# Patient Record
Sex: Female | Born: 1950 | Race: White | Hispanic: No | Smoking: Never smoker
Health system: Southern US, Community
[De-identification: ages and names within clinical notes are randomized; demographics above are authoritative.]

## PROBLEM LIST (undated history)

## (undated) DIAGNOSIS — J45909 Unspecified asthma, uncomplicated: Secondary | ICD-10-CM

## (undated) DIAGNOSIS — J302 Other seasonal allergic rhinitis: Secondary | ICD-10-CM

---

## 2000-03-11 ENCOUNTER — Encounter: Payer: Self-pay | Admitting: Obstetrics and Gynecology

## 2000-03-11 ENCOUNTER — Encounter: Admission: RE | Admit: 2000-03-11 | Discharge: 2000-03-11 | Payer: Self-pay | Admitting: Obstetrics and Gynecology

## 2000-03-22 ENCOUNTER — Encounter (INDEPENDENT_AMBULATORY_CARE_PROVIDER_SITE_OTHER): Payer: Self-pay

## 2000-03-22 ENCOUNTER — Other Ambulatory Visit: Admission: RE | Admit: 2000-03-22 | Discharge: 2000-03-22 | Payer: Self-pay | Admitting: *Deleted

## 2000-08-23 ENCOUNTER — Encounter: Payer: Self-pay | Admitting: Allergy and Immunology

## 2000-08-23 ENCOUNTER — Encounter: Admission: RE | Admit: 2000-08-23 | Discharge: 2000-08-23 | Payer: Self-pay | Admitting: *Deleted

## 2000-09-26 ENCOUNTER — Other Ambulatory Visit: Admission: RE | Admit: 2000-09-26 | Discharge: 2000-09-26 | Payer: Self-pay | Admitting: Obstetrics and Gynecology

## 2000-12-02 ENCOUNTER — Ambulatory Visit (HOSPITAL_BASED_OUTPATIENT_CLINIC_OR_DEPARTMENT_OTHER): Admission: RE | Admit: 2000-12-02 | Discharge: 2000-12-02 | Payer: Self-pay | Admitting: Family Medicine

## 2001-03-12 ENCOUNTER — Encounter: Payer: Self-pay | Admitting: Obstetrics and Gynecology

## 2001-03-12 ENCOUNTER — Encounter: Admission: RE | Admit: 2001-03-12 | Discharge: 2001-03-12 | Payer: Self-pay | Admitting: Obstetrics and Gynecology

## 2002-04-14 ENCOUNTER — Other Ambulatory Visit: Admission: RE | Admit: 2002-04-14 | Discharge: 2002-04-14 | Payer: Self-pay | Admitting: Obstetrics and Gynecology

## 2002-05-01 ENCOUNTER — Encounter: Payer: Self-pay | Admitting: Obstetrics and Gynecology

## 2002-05-01 ENCOUNTER — Encounter: Admission: RE | Admit: 2002-05-01 | Discharge: 2002-05-01 | Payer: Self-pay | Admitting: Obstetrics and Gynecology

## 2003-09-07 ENCOUNTER — Ambulatory Visit (HOSPITAL_COMMUNITY): Admission: RE | Admit: 2003-09-07 | Discharge: 2003-09-07 | Payer: Self-pay | Admitting: Gastroenterology

## 2003-09-27 ENCOUNTER — Ambulatory Visit (HOSPITAL_COMMUNITY): Admission: RE | Admit: 2003-09-27 | Discharge: 2003-09-27 | Payer: Self-pay | Admitting: Endocrinology

## 2003-10-21 ENCOUNTER — Other Ambulatory Visit: Admission: RE | Admit: 2003-10-21 | Discharge: 2003-10-21 | Payer: Self-pay | Admitting: Gynecology

## 2004-10-06 ENCOUNTER — Encounter: Admission: RE | Admit: 2004-10-06 | Discharge: 2004-10-06 | Payer: Self-pay | Admitting: Gynecology

## 2004-11-28 ENCOUNTER — Other Ambulatory Visit: Admission: RE | Admit: 2004-11-28 | Discharge: 2004-11-28 | Payer: Self-pay | Admitting: Gynecology

## 2006-03-07 ENCOUNTER — Other Ambulatory Visit: Admission: RE | Admit: 2006-03-07 | Discharge: 2006-03-07 | Payer: Self-pay | Admitting: Gynecology

## 2007-07-03 ENCOUNTER — Encounter: Admission: RE | Admit: 2007-07-03 | Discharge: 2007-07-03 | Payer: Self-pay | Admitting: Family Medicine

## 2008-06-01 ENCOUNTER — Ambulatory Visit (HOSPITAL_BASED_OUTPATIENT_CLINIC_OR_DEPARTMENT_OTHER): Admission: RE | Admit: 2008-06-01 | Discharge: 2008-06-01 | Payer: Self-pay | Admitting: Orthopedic Surgery

## 2008-07-15 ENCOUNTER — Encounter: Admission: RE | Admit: 2008-07-15 | Discharge: 2008-07-15 | Payer: Self-pay | Admitting: Gynecology

## 2010-05-14 ENCOUNTER — Encounter: Payer: Self-pay | Admitting: Family Medicine

## 2010-05-24 ENCOUNTER — Encounter: Payer: Self-pay | Admitting: Family Medicine

## 2010-08-08 LAB — POCT HEMOGLOBIN-HEMACUE: Hemoglobin: 12.6 g/dL (ref 12.0–15.0)

## 2010-09-05 NOTE — Op Note (Signed)
NAMEMERRANDA, BOLLS              ACCOUNT NO.:  [id number]   MEDICAL RECORD NO.:  [id number]          PATIENT TYPE:  AMB   LOCATION:  DSC                          FACILITY:  [facility]   PHYSICIAN:  Cindee Salt, M.D.       DATE OF BIRTH:  09-May-1950   DATE OF PROCEDURE:  06/01/2008  DATE OF DISCHARGE:                               OPERATIVE REPORT   PREOPERATIVE DIAGNOSIS:  Stenosing tenosynovitis, right ring finger.   POSTOPERATIVE DIAGNOSIS:  Stenosing tenosynovitis, right ring finger.   OPERATION:  Release of A1 pulley, right ring finger.   SURGEON:  Cindee Salt, MD   ASSISTANT:  Carolyne Fiscal, RN   ANESTHESIA:  Forearm-based IV regional.   ANESTHESIOLOGIST:  Zenon Mayo, MD   HISTORY:  The patient is a 60 year old female with a history of  triggering of right ring finger.  This has not responded to conservative  treatment.  She has elected to undergo surgical release.  Pre, peri, and  postoperative course have been described along with risks and  complications.  She is aware that there is no guarantee with surgery,  possibility of infection, recurrence injury to arteries, nerves, and  tendons, incomplete relief of symptoms, and dystrophy.  In the  preoperative area, the patient is seen.  The extremity is marked by both  the patient and surgeon.  Antibiotic is given.   PROCEDURE:  The patient was brought to the operating room where a  forearm-based IV regional anesthetic was carried out without difficulty.  She was prepped using DuraPrep, supine position with the right arm free.  A time-out was taken confirming the patient and procedure.  A test of  the extremity was performed.  She had some feeling.  This was locally  infiltrated with 0.25% Marcaine without epinephrine.  After adequate  anesthesia was afforded to the patient, an oblique incision was made  over the A1 pulley of the right ring finger and carried down through the  subcutaneous tissue.  Bleeders were  electrocauterized with bipolar.  The  neurovascular structures were identified and protected.  Retractors were  placed.  The A1 pulley was then identified on the radial aspect of the  A1 pulley.  An incision was made.  A small incision was made centrally  in the A2 pulley.  Finger was placed through a full range motion.  No  further triggering was noted.  Partial tenosynovectomy was performed.  The wound was copiously irrigated with saline.  The skin was then closed  with interrupted 5-0 Vicryl Rapide sutures.  A sterile compressive  dressing was applied.  The patient tolerated the procedure well.  On  deflation of the tourniquet, all fingers immediately pinked.  She was  taken to the recovery room for observation in satisfactory condition.  She will be discharged home to return to the [facility] of [place]  in 1 week on Vicodin.           ______________________________  Cindee Salt, M.D.     GK/MEDQ  D:  06/01/2008  T:  06/02/2008  Job:  [id number]   cc:  Selinda Flavin

## 2010-09-08 NOTE — Op Note (Signed)
NAME:  Diana Murray, Diana Murray                        ACCOUNT NO.:  [id number]   MEDICAL RECORD NO.:  [id number]                   PATIENT TYPE:  AMB   LOCATION:  ENDO                                 FACILITY:  [facility]   PHYSICIAN:  Graylin Shiver, M.D.                DATE OF BIRTH:  Sep 13, 1950   DATE OF PROCEDURE:  09/07/2003  DATE OF DISCHARGE:                                 OPERATIVE REPORT   PROCEDURE:  Colonoscopy with biopsy.   ENDOSCOPIST:  Graylin Shiver, M.D.   INDICATIONS FOR PROCEDURE:  Family history of colon polyps.   Informed consent was obtained after explanation of the risks of bleeding  infection and perforation.   PREOPERATIVE MEDICATIONS:  Fentanyl 50 mcg IV, Versed 7 mg IV.   DESCRIPTION OF PROCEDURE:  With the patient in the left lateral decubitus  position a rectal examination was performed; no masses were felt.  The  Olympus colonoscope was inserted into the rectum and advanced around the  colon to the cecum.  Cecal landmarks were identified.  The ileocecal valve  was intubated and the first few centimeters of terminal ileum were inspected  and looked normal.  The cecum and ascending colon were normal.  The  transverse colon normal.  The descending colon, sigmoid and rectum were  normal.   She tolerated the procedure well without complications.   IMPRESSION:  Normal colonoscopy to the cecum.   RECOMMENDATIONS:  I would recommend a follow up colonoscopy in 5 years in  view of the family history of colon polyps in her mother.                                               Graylin Shiver, M.D.    SFG/MEDQ  D:  09/07/2003  T:  09/07/2003  Job:  [id number]   cc:   Clovis Riley, NP  [facility] at [facility]

## 2010-12-20 ENCOUNTER — Other Ambulatory Visit: Payer: Self-pay | Admitting: Gynecology

## 2010-12-20 DIAGNOSIS — Z1231 Encounter for screening mammogram for malignant neoplasm of breast: Secondary | ICD-10-CM

## 2011-01-02 ENCOUNTER — Ambulatory Visit
Admission: RE | Admit: 2011-01-02 | Discharge: 2011-01-02 | Disposition: A | Discharge status: Home / Self Care | Payer: BC Managed Care – PPO | Source: Ambulatory Visit | Attending: Gynecology | Admitting: Gynecology

## 2011-01-02 DIAGNOSIS — Z1231 Encounter for screening mammogram for malignant neoplasm of breast: Secondary | ICD-10-CM

## 2015-05-17 ENCOUNTER — Other Ambulatory Visit: Payer: Self-pay

## 2015-05-17 DIAGNOSIS — Z1231 Encounter for screening mammogram for malignant neoplasm of breast: Secondary | ICD-10-CM

## 2015-05-18 ENCOUNTER — Other Ambulatory Visit: Payer: Self-pay | Admitting: Family Medicine

## 2015-05-18 DIAGNOSIS — N644 Mastodynia: Secondary | ICD-10-CM

## 2015-05-19 ENCOUNTER — Ambulatory Visit
Admission: RE | Admit: 2015-05-19 | Discharge: 2015-05-19 | Disposition: A | Discharge status: Home / Self Care | Payer: BLUE CROSS/BLUE SHIELD | Source: Ambulatory Visit | Attending: Family Medicine | Admitting: Family Medicine

## 2015-05-19 DIAGNOSIS — N644 Mastodynia: Secondary | ICD-10-CM

## 2015-12-13 ENCOUNTER — Inpatient Hospital Stay (HOSPITAL_COMMUNITY)
Admission: EM | Admit: 2015-12-13 | Discharge: 2015-12-22 | DRG: 163 | Disposition: A | Discharge status: Home Health | Payer: BLUE CROSS/BLUE SHIELD | Attending: Internal Medicine | Admitting: Internal Medicine

## 2015-12-13 ENCOUNTER — Encounter (HOSPITAL_COMMUNITY): Payer: Self-pay | Admitting: *Deleted

## 2015-12-13 ENCOUNTER — Emergency Department (HOSPITAL_COMMUNITY): Payer: BLUE CROSS/BLUE SHIELD

## 2015-12-13 DIAGNOSIS — D72829 Elevated white blood cell count, unspecified: Secondary | ICD-10-CM | POA: Diagnosis present

## 2015-12-13 DIAGNOSIS — J90 Pleural effusion, not elsewhere classified: Secondary | ICD-10-CM

## 2015-12-13 DIAGNOSIS — E872 Acidosis: Secondary | ICD-10-CM | POA: Diagnosis present

## 2015-12-13 DIAGNOSIS — R51 Headache: Secondary | ICD-10-CM | POA: Diagnosis not present

## 2015-12-13 DIAGNOSIS — G44219 Episodic tension-type headache, not intractable: Secondary | ICD-10-CM | POA: Diagnosis not present

## 2015-12-13 DIAGNOSIS — J45909 Unspecified asthma, uncomplicated: Secondary | ICD-10-CM | POA: Diagnosis present

## 2015-12-13 DIAGNOSIS — A498 Other bacterial infections of unspecified site: Secondary | ICD-10-CM | POA: Diagnosis present

## 2015-12-13 DIAGNOSIS — W19XXXA Unspecified fall, initial encounter: Secondary | ICD-10-CM | POA: Diagnosis present

## 2015-12-13 DIAGNOSIS — N390 Urinary tract infection, site not specified: Secondary | ICD-10-CM | POA: Diagnosis present

## 2015-12-13 DIAGNOSIS — R739 Hyperglycemia, unspecified: Secondary | ICD-10-CM | POA: Diagnosis present

## 2015-12-13 DIAGNOSIS — J189 Pneumonia, unspecified organism: Principal | ICD-10-CM | POA: Diagnosis present

## 2015-12-13 DIAGNOSIS — J918 Pleural effusion in other conditions classified elsewhere: Secondary | ICD-10-CM | POA: Diagnosis present

## 2015-12-13 DIAGNOSIS — B9620 Unspecified Escherichia coli [E. coli] as the cause of diseases classified elsewhere: Secondary | ICD-10-CM | POA: Diagnosis present

## 2015-12-13 DIAGNOSIS — Z79899 Other long term (current) drug therapy: Secondary | ICD-10-CM | POA: Diagnosis not present

## 2015-12-13 DIAGNOSIS — D638 Anemia in other chronic diseases classified elsewhere: Secondary | ICD-10-CM | POA: Diagnosis present

## 2015-12-13 DIAGNOSIS — J9811 Atelectasis: Secondary | ICD-10-CM | POA: Diagnosis not present

## 2015-12-13 DIAGNOSIS — R0782 Intercostal pain: Secondary | ICD-10-CM | POA: Diagnosis not present

## 2015-12-13 DIAGNOSIS — J9601 Acute respiratory failure with hypoxia: Secondary | ICD-10-CM | POA: Diagnosis not present

## 2015-12-13 DIAGNOSIS — J869 Pyothorax without fistula: Secondary | ICD-10-CM | POA: Diagnosis not present

## 2015-12-13 DIAGNOSIS — R079 Chest pain, unspecified: Secondary | ICD-10-CM | POA: Diagnosis present

## 2015-12-13 DIAGNOSIS — R072 Precordial pain: Secondary | ICD-10-CM | POA: Diagnosis not present

## 2015-12-13 DIAGNOSIS — Z9181 History of falling: Secondary | ICD-10-CM | POA: Diagnosis not present

## 2015-12-13 DIAGNOSIS — Z9689 Presence of other specified functional implants: Secondary | ICD-10-CM

## 2015-12-13 DIAGNOSIS — J948 Other specified pleural conditions: Secondary | ICD-10-CM | POA: Diagnosis not present

## 2015-12-13 DIAGNOSIS — R071 Chest pain on breathing: Secondary | ICD-10-CM

## 2015-12-13 DIAGNOSIS — R000 Tachycardia, unspecified: Secondary | ICD-10-CM | POA: Diagnosis present

## 2015-12-13 DIAGNOSIS — R0602 Shortness of breath: Secondary | ICD-10-CM

## 2015-12-13 DIAGNOSIS — Z419 Encounter for procedure for purposes other than remedying health state, unspecified: Secondary | ICD-10-CM

## 2015-12-13 DIAGNOSIS — Z9889 Other specified postprocedural states: Secondary | ICD-10-CM | POA: Diagnosis not present

## 2015-12-13 DIAGNOSIS — R918 Other nonspecific abnormal finding of lung field: Secondary | ICD-10-CM

## 2015-12-13 DIAGNOSIS — Z880 Allergy status to penicillin: Secondary | ICD-10-CM | POA: Diagnosis not present

## 2015-12-13 DIAGNOSIS — R519 Headache, unspecified: Secondary | ICD-10-CM | POA: Diagnosis present

## 2015-12-13 DIAGNOSIS — A419 Sepsis, unspecified organism: Secondary | ICD-10-CM | POA: Diagnosis not present

## 2015-12-13 HISTORY — DX: Unspecified asthma, uncomplicated: J45.909

## 2015-12-13 HISTORY — DX: Other seasonal allergic rhinitis: J30.2

## 2015-12-13 LAB — COMPREHENSIVE METABOLIC PANEL
ALT: 18 U/L (ref 14–54)
AST: 18 U/L (ref 15–41)
Albumin: 3.8 g/dL (ref 3.5–5.0)
Alkaline Phosphatase: 74 U/L (ref 38–126)
Anion gap: 7 (ref 5–15)
BUN: 13 mg/dL (ref 6–20)
CHLORIDE: 101 mmol/L (ref 101–111)
CO2: 26 mmol/L (ref 22–32)
CREATININE: 0.94 mg/dL (ref 0.44–1.00)
Calcium: 9.1 mg/dL (ref 8.9–10.3)
GFR calc non Af Amer: >60 mL/min (ref >60)
Glucose, Bld: 128 mg/dL — ABNORMAL HIGH (ref 65–99)
Potassium: 4.4 mmol/L (ref 3.5–5.1)
SODIUM: 134 mmol/L — AB (ref 135–145)
Total Bilirubin: 0.5 mg/dL (ref 0.3–1.2)
Total Protein: 8.5 g/dL — ABNORMAL HIGH (ref 6.5–8.1)

## 2015-12-13 LAB — URINALYSIS, ROUTINE W REFLEX MICROSCOPIC
BILIRUBIN URINE: NEGATIVE
GLUCOSE, UA: NEGATIVE mg/dL
Ketones, ur: NEGATIVE mg/dL
Nitrite: NEGATIVE
PROTEIN: 30 mg/dL — AB
Specific Gravity, Urine: 1.023 (ref 1.005–1.030)
pH: 5.5 (ref 5.0–8.0)

## 2015-12-13 LAB — URINE MICROSCOPIC-ADD ON

## 2015-12-13 LAB — CBC
HCT: 31 % — ABNORMAL LOW (ref 36.0–46.0)
Hemoglobin: 9.8 g/dL — ABNORMAL LOW (ref 12.0–15.0)
MCH: 26.6 pg (ref 26.0–34.0)
MCHC: 31.6 g/dL (ref 30.0–36.0)
MCV: 84.2 fL (ref 78.0–100.0)
PLATELETS: 480 10*3/uL — AB (ref 150–400)
RBC: 3.68 MIL/uL — AB (ref 3.87–5.11)
RDW: 16 % — AB (ref 11.5–15.5)
WBC: 18.3 10*3/uL — ABNORMAL HIGH (ref 4.0–10.5)

## 2015-12-13 LAB — I-STAT CG4 LACTIC ACID, ED
LACTIC ACID, VENOUS: 1.08 mmol/L (ref 0.5–1.9)
Lactic Acid, Venous: 3.56 mmol/L (ref 0.5–1.9)

## 2015-12-13 LAB — LIPASE, BLOOD: LIPASE: 17 U/L (ref 11–51)

## 2015-12-13 MED ORDER — SODIUM CHLORIDE 0.9 % IV BOLUS (SEPSIS)
1000.0000 mL | Freq: Once | INTRAVENOUS | Status: AC
  Administered 2015-12-13: 1000 mL via INTRAVENOUS

## 2015-12-13 MED ORDER — LEVOFLOXACIN IN D5W 750 MG/150ML IV SOLN
750.0000 mg | INTRAVENOUS | Status: DC
  Administered 2015-12-14 – 2015-12-18 (×5): 750 mg via INTRAVENOUS
  Filled 2015-12-13 (×7): qty 150

## 2015-12-13 MED ORDER — ONDANSETRON HCL 4 MG/2ML IJ SOLN
4.0000 mg | Freq: Once | INTRAMUSCULAR | Status: AC
  Administered 2015-12-13: 4 mg via INTRAVENOUS
  Filled 2015-12-13: qty 2

## 2015-12-13 MED ORDER — MORPHINE SULFATE (PF) 4 MG/ML IV SOLN
4.0000 mg | Freq: Once | INTRAVENOUS | Status: AC
  Administered 2015-12-13: 4 mg via INTRAVENOUS
  Filled 2015-12-13: qty 1

## 2015-12-13 MED ORDER — METHOCARBAMOL 1000 MG/10ML IJ SOLN
1000.0000 mg | Freq: Once | INTRAMUSCULAR | Status: DC
  Filled 2015-12-13: qty 10

## 2015-12-13 MED ORDER — ONDANSETRON 4 MG PO TBDP
4.0000 mg | ORAL_TABLET | Freq: Once | ORAL | Status: AC | PRN
  Administered 2015-12-13: 4 mg via ORAL
  Filled 2015-12-13: qty 1

## 2015-12-13 MED ORDER — METHOCARBAMOL 1000 MG/10ML IJ SOLN
1000.0000 mg | Freq: Once | INTRAVENOUS | Status: AC
  Administered 2015-12-13: 1000 mg via INTRAVENOUS
  Filled 2015-12-13: qty 10

## 2015-12-13 MED ORDER — MORPHINE SULFATE (PF) 4 MG/ML IV SOLN
4.0000 mg | Freq: Once | INTRAVENOUS | Status: AC
Start: 2015-12-13 — End: 2015-12-13
  Administered 2015-12-13: 4 mg via INTRAVENOUS
  Filled 2015-12-13: qty 1

## 2015-12-13 MED ORDER — LEVOFLOXACIN IN D5W 750 MG/150ML IV SOLN
750.0000 mg | Freq: Once | INTRAVENOUS | Status: AC
  Administered 2015-12-13: 750 mg via INTRAVENOUS
  Filled 2015-12-13: qty 150

## 2015-12-13 MED ORDER — SODIUM CHLORIDE 0.9 % IV SOLN
1000.0000 mL | INTRAVENOUS | Status: DC
  Administered 2015-12-13: 1000 mL via INTRAVENOUS

## 2015-12-13 NOTE — ED Notes (Signed)
Gave report to [place], Therapist, sports on [address]

## 2015-12-13 NOTE — H&P (Signed)
[facility] History and Physical  ARNESHIA BARROIS [id number] DOB: 01-22-1951 DOA: 12/13/2015  Referring physician: Raquel James, PA, [facility] ED PCP: Marda Stalker, PA-C   Chief Complaint: L chest pain and cough  HPI: Diana Murray is a 65 y.o. female with no specific PMH presented to ED today with chills, N/V and L rib pain.  Only sig history is asthma, uses rescue inhaler infrequently. Four days ago developed chills, then nonprod cough.  Over last 48 hrs developed malaise and worsening pain in L chest, L back and now L breast.  Worse w sitting still and coughing.  Better w walking around.  Can't lie on L side.  Fevers at home 99.5 deg F. No abd pain, no dysuria. Not keeping food down. Saw PCP yesterday who did CXR and started Levaquin for PNA.  No better today. In ED today  CXR showing dense LLL infiltrate.  Asked to see for admit.      ROS  no joint pain   no HA  no blurry vision  no rash  no diarrhea  no dysuria  no difficulty voiding  no change in urine color    Past Medical History History reviewed. No pertinent past medical history. Past Surgical History History reviewed. No pertinent surgical history. Family History No family history on file. Social History  reports that she has never smoked. She has never used smokeless tobacco. She reports that she drinks alcohol. Her drug history is not on file. Allergies  Allergies  Allergen Reactions  . Penicillins Anaphylaxis and Other (See Comments)    Has patient had a PCN reaction causing immediate rash, facial/tongue/throat swelling, SOB or lightheadedness with hypotension: Yes Has patient had a PCN reaction causing severe rash involving mucus membranes or skin necrosis: No Has patient had a PCN reaction that required hospitalization No Has patient had a PCN reaction occurring within the last 10 years: No If all of the above answers are "NO", then may proceed with Cephalosporin use.   Home medications Prior to  Admission medications   Medication Sig Start Date End Date Taking? Authorizing Provider  Calcium Carbonate-Vitamin D (CALCIUM 600+D) 600-400 MG-UNIT tablet Take 1 tablet by mouth daily.   Yes Historical Provider, MD  cholecalciferol (VITAMIN D) 1000 units tablet Take 1,000 Units by mouth daily.   Yes Historical Provider, MD  fluticasone (FLONASE) 50 MCG/ACT nasal spray Place 2 sprays into both nostrils daily as needed for rhinitis.   Yes Historical Provider, MD  levofloxacin (LEVAQUIN) 750 MG tablet Take 750 mg by mouth daily. 12/12/15 12/19/15 Yes Historical Provider, MD  loratadine (CLARITIN) 10 MG tablet Take 10 mg by mouth daily as needed for allergies.   Yes Historical Provider, MD  oxybutynin (DITROPAN-XL) 5 MG 24 hr tablet Take 5 mg by mouth daily.   Yes Historical Provider, MD  traMADol (ULTRAM) 50 MG tablet Take 50 mg by mouth every 6 (six) hours as needed for moderate pain.   Yes Historical Provider, MD   Liver Function Tests  Recent Labs Lab 12/13/15 1510  AST 18  ALT 18  ALKPHOS 74  BILITOT 0.5  PROT 8.5*  ALBUMIN 3.8    Recent Labs Lab 12/13/15 1510  LIPASE 17   CBC  Recent Labs Lab 12/13/15 1510  WBC 18.3*  HGB 9.8*  HCT 31.0*  MCV 84.2  PLT 0000000*   Basic Metabolic Panel  Recent Labs Lab 12/13/15 1510  NA 134*  K 4.4  CL 101  CO2 26  GLUCOSE 128*  BUN 13  CREATININE 0.94  CALCIUM 9.1     Vitals:   12/13/15 1445 12/13/15 1832 12/13/15 2050 12/14/15 0000  BP: 126/75 121/63 136/67 104/61  Pulse: 105 102 110 88  Resp: 15 16 17 18   Temp: 99.4 F (37.4 C) 98.6 F (37 C)  98.2 F (36.8 C)  TempSrc: Oral Oral  Oral  SpO2: 93% 93% 99% 94%  Weight: 83.5 kg (184 lb)   90.3 kg (199 lb 1.2 oz)  Height: 5\' 7"  (1.702 m)   5\' 8"  (1.727 m)   Exam: Gen alert, in pain when coughs No rash, cyanosis or gangrene Sclera anicteric, throat clear  No jvd or bruits Chest R clear, L base augmented BS and coarse rales RRR no MRG Abd soft ntnd no mass or  ascites +bs GU defer MS no joint effusions or deformity Ext no LE or UE edema / no wounds or ulcers Neuro is alert, Ox 3 , nf, ambulating in room   Na 134 K 4.4 BUN 13  Cr 0.94   LFT's wnl  Alb 3.8 WBC 18k  Hb 9.8 mcv 84   plt 480  EKG (independ reviewed) > NSR, no acute changes CXR (independ reviewed) > L basilar dense infiltrate, possible R basilar infiltrate too  UA 6-30 epis and wbc's, many bact, cloudy   Assessment: 1.   Comm-acquired PNA - pleuritic L sided CP likely due to PNA. ^^WBC but not septic. Admit, IVF, pain meds, IV abx for CAP and IV medication for nausea/ muscle cramping.  2.   Fall - fell a couple of weeks ago, hit R forehead and having headaches.  Was not seen. Will get CT in am.   3.  PCN allergy - severe reaction 4.  Pyuria/ bacteriuria - await UCx  Plan - as above     [place] D [facility] Pager [phone]  Cell [phone]  If 7PM-7AM, please contact night-coverage [email or web address] Password [email or web address] 12/14/2015, 1:53 AM

## 2015-12-13 NOTE — Progress Notes (Signed)
Pharmacy Antibiotic Note  Diana Murray is a 65 y.o. female admitted on 12/13/2015 with CAP.  Patient was diagnosed with CAP yesterday at her PCP office and was prescribed Levaquin which she took yesterday but vomited her dose up this morning.  Here with N/V, chills.  Pharmacy has been consulted for Levaquin dosing.  Plan: Levaquin 750 mg IV q24h. Renal function WNL and need for further dosage adjustment appears unlikely at present.  Will sign off at this time.  Please reconsult if a change in clinical status warrants re-evaluation of dosage.   Height: 5\' 7"  (170.2 cm) Weight: 184 lb (83.5 kg) IBW/kg (Calculated) : 61.6  Temp (24hrs), Avg:99 F (37.2 C), Min:98.6 F (37 C), Max:99.4 F (37.4 C)   Recent Labs Lab 12/13/15 1510  WBC 18.3*  CREATININE 0.94    Estimated Creatinine Clearance: 67.2 mL/min (by C-G formula based on SCr of 0.94 mg/dL).    Allergies  Allergen Reactions  . Penicillins     Antimicrobials this admission: 8/21 Levaquin >>   Dose adjustments this admission: --  Microbiology results: 8/22 UCx: sent   Thank you for allowing pharmacy to be a part of this patient's care.  Hershal Coria 12/13/2015 7:18 PM

## 2015-12-13 NOTE — ED Provider Notes (Signed)
[facility] DEPT Provider Note   CSN: [id number] Arrival date & time: 12/13/15  1419     History   Chief Complaint Chief Complaint  Patient presents with  . Chills  . Emesis  . rib cage pain    HPI Diana Murray is a 65 y.o. generally healthy female presents with chills, N/V, and L rib pain. PMH significant for asthma. She states that she had a fall 4 days ago and hit her head. She was not evaluated at that time. 2 days ago she started to have chills, malaise, and L sided rib pain. She went to her PCP who did a Xray and diagnosed her with PNA. She was started on Levaquin and given Tramadol for pain. She took one dose of Levaquin yesterday. She tried to take her dose today but threw it up so she decided to come to the ED for further evaluation. She reports an elevated temp of 99.6, chills, SOB, dry cough. Denies chest pain, wheezing, abdominal pain, diarrhea, irritative voiding symptoms.  HPI  History reviewed. No pertinent past medical history.  There are no active problems to display for this patient.   History reviewed. No pertinent surgical history.  OB History    No data available       Home Medications    Prior to Admission medications   Not on File    Family History No family history on file.  Social History Social History  Substance Use Topics  . Smoking status: Never Smoker  . Smokeless tobacco: Never Used  . Alcohol use Yes     Allergies   Penicillins   Review of Systems Review of Systems  Constitutional: Positive for chills and fever.  Respiratory: Positive for cough and shortness of breath. Negative for wheezing.   Cardiovascular: Negative for chest pain.  Gastrointestinal: Positive for nausea and vomiting. Negative for abdominal pain and diarrhea.  All other systems reviewed and are negative.    Physical Exam Updated Vital Signs BP 121/63 (BP Location: Left Arm)   Pulse 102   Temp 98.6 F (37 C) (Oral)   Resp 16   Ht 5\' 7"   (1.702 m)   Wt 83.5 kg   SpO2 93%   BMI 28.82 kg/m   Physical Exam  Constitutional: She is oriented to person, place, and time. She appears well-developed and well-nourished.  Standing in room - unable to sit in bed  HENT:  Head: Normocephalic and atraumatic.  Eyes: Conjunctivae are normal. Pupils are equal, round, and reactive to light. Right eye exhibits no discharge. Left eye exhibits no discharge. No scleral icterus.  Neck: Normal range of motion. Neck supple.  Cardiovascular: Regular rhythm.  Tachycardia present.  Exam reveals no gallop and no friction rub.   No murmur heard. Pulmonary/Chest: Effort normal. No respiratory distress. She has decreased breath sounds in the left lower field. She has no wheezes. She has no rhonchi. She has no rales. She exhibits no tenderness.  Abdominal: Soft. She exhibits no distension. There is no tenderness.  Musculoskeletal: She exhibits no edema.  Left sided flank tenderness and posterior left sided rib cage tenderness. No midline spinal tenderness  Neurological: She is alert and oriented to person, place, and time.  Skin: Skin is warm and dry.  Psychiatric: She has a normal mood and affect. Her behavior is normal.  Nursing note and vitals reviewed.    ED Treatments / Results  Labs (all labs ordered are listed, but only abnormal results are displayed) Labs  Reviewed  COMPREHENSIVE METABOLIC PANEL - Abnormal; Notable for the following:       Result Value   Sodium 134 (*)    Glucose, Bld 128 (*)    Total Protein 8.5 (*)    All other components within normal limits  CBC - Abnormal; Notable for the following:    WBC 18.3 (*)    RBC 3.68 (*)    Hemoglobin 9.8 (*)    HCT 31.0 (*)    RDW 16.0 (*)    Platelets 480 (*)    All other components within normal limits  URINALYSIS, ROUTINE W REFLEX MICROSCOPIC (NOT AT [facility]) - Abnormal; Notable for the following:    APPearance CLOUDY (*)    Hgb urine dipstick TRACE (*)    Protein, ur 30 (*)     Leukocytes, UA SMALL (*)    All other components within normal limits  URINE MICROSCOPIC-ADD ON - Abnormal; Notable for the following:    Squamous Epithelial / LPF 6-30 (*)    Bacteria, UA MANY (*)    All other components within normal limits  CULTURE, BLOOD (ROUTINE X 2)  CULTURE, BLOOD (ROUTINE X 2)  URINE CULTURE  LIPASE, BLOOD  I-STAT CG4 LACTIC ACID, ED  I-STAT CG4 LACTIC ACID, ED    EKG  EKG Interpretation None       Radiology Dg Chest 2 View  Result Date: 12/13/2015 CLINICAL DATA:  Low-grade fever and left lower posterior chest pain. EXAM: CHEST  2 VIEW COMPARISON:  12/12/2015 FINDINGS: The cardiac silhouette is mildly enlarged. Mediastinal contours appear intact. There is no evidence of pneumothorax. There are persistent bilateral pleural effusions with bibasilar subsegmental atelectasis. More focal airspace consolidation versus pulmonary mass is seen in the left lower lobe. Osseous structures are without acute abnormality. Soft tissues are grossly normal. IMPRESSION: Airspace consolidation versus pulmonary mass in the left lower lobe. Bilateral pleural effusions with bibasilar atelectasis. Electronically Signed   By: Fidela Salisbury M.D.   On: 12/13/2015 19:13    Procedures Procedures (including critical care time)  Medications Ordered in ED Medications  0.9 %  sodium chloride infusion (not administered)  sodium chloride 0.9 % bolus 1,000 mL (0 mLs Intravenous Stopped 12/13/15 2005)    And  sodium chloride 0.9 % bolus 1,000 mL (0 mLs Intravenous Stopped 12/13/15 2119)    And  sodium chloride 0.9 % bolus 1,000 mL (1,000 mLs Intravenous New Bag/Given 12/13/15 2119)  levofloxacin (LEVAQUIN) IVPB 750 mg (not administered)  methocarbamol (ROBAXIN) 1,000 mg in dextrose 5 % 50 mL IVPB (not administered)  ondansetron (ZOFRAN-ODT) disintegrating tablet 4 mg (4 mg Oral Given 12/13/15 1506)  levofloxacin (LEVAQUIN) IVPB 750 mg (0 mg Intravenous Stopped 12/13/15 2119)  morphine  4 MG/ML injection 4 mg (4 mg Intravenous Given 12/13/15 1939)  morphine 4 MG/ML injection 4 mg (4 mg Intravenous Given 12/13/15 2120)  ondansetron (ZOFRAN) injection 4 mg (4 mg Intravenous Given 12/13/15 2120)    Initial Impression / Assessment and Plan / ED Course  I have reviewed the triage vital signs and the nursing notes.  Pertinent labs & imaging results that were available during my care of the patient were reviewed by me and considered in my medical decision making (see chart for details).  Clinical Course   65 year old female presents with sepsis due to PNA. She is tachycardic but not hypoxic or febrile. No respiratory distress. She appears ill on exam but NAD. Decreased lung sounds in the LLL. CXR shows infiltrate  in LLL. CBC remarkable for leukocytosis of 18.3 and anemia.  CMP overall unremarkable. UA shows many bacteria, trace hgb, small leukocytes, 30 protein but appears contaminated. Lactic acid normal. EKG shows sinus tachycardia. Code sepsis initiated. Levaquin given along with morphine and zofran.   On recheck patient is still uncomfortable and nauseous. Will admit for further management.    Final Clinical Impressions(s) / ED Diagnoses   Final diagnoses:  CAP (community acquired pneumonia)    New Prescriptions New Prescriptions   No medications on file     Recardo Evangelist, Hershal Coria 12/13/15 2131    Lacretia Leigh, MD 12/14/15 [phone]

## 2015-12-13 NOTE — ED Notes (Signed)
RN currently at bedside drawing labs.

## 2015-12-13 NOTE — ED Triage Notes (Signed)
Pt complains of nausea/vomiting, left rib pain and chills. Pt states she was diagnosed with pneumonia at her doctor's office yesterday. Pt was put on Levaquin and tramadol. Pt states she vomited her Levaquin this morning, pt was able to take yesterday's dose.

## 2015-12-14 ENCOUNTER — Inpatient Hospital Stay (HOSPITAL_COMMUNITY): Payer: BLUE CROSS/BLUE SHIELD

## 2015-12-14 ENCOUNTER — Encounter (HOSPITAL_COMMUNITY): Payer: Self-pay

## 2015-12-14 DIAGNOSIS — Z9181 History of falling: Secondary | ICD-10-CM

## 2015-12-14 DIAGNOSIS — R079 Chest pain, unspecified: Secondary | ICD-10-CM | POA: Diagnosis present

## 2015-12-14 DIAGNOSIS — R519 Headache, unspecified: Secondary | ICD-10-CM | POA: Diagnosis present

## 2015-12-14 DIAGNOSIS — D72829 Elevated white blood cell count, unspecified: Secondary | ICD-10-CM | POA: Diagnosis present

## 2015-12-14 DIAGNOSIS — R51 Headache: Secondary | ICD-10-CM

## 2015-12-14 LAB — BASIC METABOLIC PANEL
ANION GAP: 6 (ref 5–15)
BUN: 10 mg/dL (ref 6–20)
CALCIUM: 8 mg/dL — AB (ref 8.9–10.3)
CO2: 21 mmol/L — AB (ref 22–32)
Chloride: 107 mmol/L (ref 101–111)
Creatinine, Ser: 0.78 mg/dL (ref 0.44–1.00)
GFR calc Af Amer: >60 mL/min (ref >60)
GFR calc non Af Amer: >60 mL/min (ref >60)
GLUCOSE: 127 mg/dL — AB (ref 65–99)
Potassium: 3.5 mmol/L (ref 3.5–5.1)
Sodium: 134 mmol/L — ABNORMAL LOW (ref 135–145)

## 2015-12-14 LAB — CBC
HEMATOCRIT: 25.9 % — AB (ref 36.0–46.0)
Hemoglobin: 8.2 g/dL — ABNORMAL LOW (ref 12.0–15.0)
MCH: 26.5 pg (ref 26.0–34.0)
MCHC: 31.7 g/dL (ref 30.0–36.0)
MCV: 83.8 fL (ref 78.0–100.0)
PLATELETS: 370 10*3/uL (ref 150–400)
RBC: 3.09 MIL/uL — ABNORMAL LOW (ref 3.87–5.11)
RDW: 15.8 % — AB (ref 11.5–15.5)
WBC: 14.2 10*3/uL — AB (ref 4.0–10.5)

## 2015-12-14 LAB — HIV ANTIBODY (ROUTINE TESTING W REFLEX): HIV Screen 4th Generation wRfx: NONREACTIVE

## 2015-12-14 LAB — LACTIC ACID, PLASMA: Lactic Acid, Venous: 0.5 mmol/L (ref 0.5–1.9)

## 2015-12-14 MED ORDER — ENOXAPARIN SODIUM 40 MG/0.4ML ~~LOC~~ SOLN
40.0000 mg | SUBCUTANEOUS | Status: DC
  Administered 2015-12-14 – 2015-12-22 (×8): 40 mg via SUBCUTANEOUS
  Filled 2015-12-14 (×8): qty 0.4

## 2015-12-14 MED ORDER — METHOCARBAMOL 500 MG PO TABS
500.0000 mg | ORAL_TABLET | Freq: Four times a day (QID) | ORAL | Status: DC | PRN
  Administered 2015-12-14 – 2015-12-15 (×2): 500 mg via ORAL
  Filled 2015-12-14 (×4): qty 1

## 2015-12-14 MED ORDER — VITAMIN D 1000 UNITS PO TABS
1000.0000 [IU] | ORAL_TABLET | Freq: Every day | ORAL | Status: DC
  Administered 2015-12-14 – 2015-12-21 (×7): 1000 [IU] via ORAL
  Filled 2015-12-14 (×9): qty 1

## 2015-12-14 MED ORDER — METHOCARBAMOL 1000 MG/10ML IJ SOLN
1000.0000 mg | Freq: Three times a day (TID) | INTRAVENOUS | Status: AC | PRN
  Administered 2015-12-15: 1000 mg via INTRAVENOUS
  Filled 2015-12-14 (×3): qty 10

## 2015-12-14 MED ORDER — KETOROLAC TROMETHAMINE 30 MG/ML IJ SOLN
30.0000 mg | Freq: Once | INTRAMUSCULAR | Status: AC
  Administered 2015-12-14: 30 mg via INTRAVENOUS
  Filled 2015-12-14: qty 1

## 2015-12-14 MED ORDER — METOCLOPRAMIDE HCL 5 MG/5ML PO SOLN
10.0000 mg | Freq: Once | ORAL | Status: AC
  Administered 2015-12-14: 10 mg via ORAL
  Filled 2015-12-14: qty 10

## 2015-12-14 MED ORDER — MORPHINE SULFATE (PF) 4 MG/ML IV SOLN
4.0000 mg | INTRAVENOUS | Status: DC | PRN
  Administered 2015-12-14 (×3): 4 mg via INTRAVENOUS
  Filled 2015-12-14 (×3): qty 1

## 2015-12-14 MED ORDER — MORPHINE SULFATE (PF) 2 MG/ML IV SOLN
2.0000 mg | INTRAVENOUS | Status: DC | PRN
  Administered 2015-12-15 – 2015-12-16 (×5): 2 mg via INTRAVENOUS
  Filled 2015-12-14 (×5): qty 1

## 2015-12-14 MED ORDER — SODIUM CHLORIDE 0.9 % IV SOLN
INTRAVENOUS | Status: DC
  Administered 2015-12-14 – 2015-12-15 (×3): via INTRAVENOUS

## 2015-12-14 MED ORDER — IOPAMIDOL (ISOVUE-300) INJECTION 61%
75.0000 mL | Freq: Once | INTRAVENOUS | Status: AC | PRN
  Administered 2015-12-14: 75 mL via INTRAVENOUS

## 2015-12-14 MED ORDER — ONDANSETRON HCL 4 MG/2ML IJ SOLN
4.0000 mg | Freq: Four times a day (QID) | INTRAMUSCULAR | Status: DC | PRN
  Administered 2015-12-14 – 2015-12-15 (×5): 4 mg via INTRAMUSCULAR
  Filled 2015-12-14 (×5): qty 2

## 2015-12-14 MED ORDER — DIPHENHYDRAMINE HCL 50 MG/ML IJ SOLN
25.0000 mg | Freq: Once | INTRAMUSCULAR | Status: AC
  Administered 2015-12-14: 25 mg via INTRAVENOUS
  Filled 2015-12-14: qty 1

## 2015-12-14 MED ORDER — CALCIUM CARBONATE-VITAMIN D 500-200 MG-UNIT PO TABS
1.0000 | ORAL_TABLET | Freq: Every day | ORAL | Status: DC
  Administered 2015-12-14 – 2015-12-21 (×7): 1 via ORAL
  Filled 2015-12-14 (×9): qty 1

## 2015-12-14 MED ORDER — OXYBUTYNIN CHLORIDE ER 5 MG PO TB24
5.0000 mg | ORAL_TABLET | Freq: Every day | ORAL | Status: DC
  Administered 2015-12-14 – 2015-12-22 (×7): 5 mg via ORAL
  Filled 2015-12-14 (×8): qty 1

## 2015-12-14 NOTE — Progress Notes (Signed)
Admitting MD paged multiple times from 12/13/15 2300-12/14/15 0145 to get orders. MD arrived to floor at 0150 to see patient. Pt then given pain and nausea medication. Will continue to monitor

## 2015-12-14 NOTE — Progress Notes (Addendum)
PROGRESS NOTE    Diana Murray  [id number] DOB: 20-Jan-1951 DOA: 12/13/2015  PCP: Marda Stalker, PA-C   Brief Narrative:  65 y/o female presenting with fever, chills, dry hacking cough and left sided chest and back pain which is worse with movement. Was started on Levaquin by her PCP a day prior to admission for LLL pneumonia.   Subjective: Continues to have pain in her back and chest wall. Cough continues. No sputum production. Mild nausea, poor PO intake.   Assessment & Plan:   Principal Problem:   CAP (community acquired pneumonia)/ leukocytosis, lactic acidosis, tachycardia, acute hypoxic respiratory failure - LLL CAP - WBC improving - tachycardia and lactic acidosis resolved - cont Levaquin - PCN allergy  Active Problems:   Chest pain - pleuritic with likely muscular component- Robaxin, K pad- control cough and treat pneumonia   Headache after recent fall - head CT negative- headache improved  Pyuria - no dysuria or increased frequency of micturation  DVT prophylaxis: Lovenox Code Status: Full code Family Communication:  Disposition Plan: home in 1-2 days Consultants:   none Procedures:   none Antimicrobials:  Anti-infectives    Start     Dose/Rate Route Frequency Ordered Stop   12/14/15 2000  levofloxacin (LEVAQUIN) IVPB 750 mg     750 mg 100 mL/hr over 90 Minutes Intravenous Every 24 hours 12/13/15 1923     12/13/15 1915  levofloxacin (LEVAQUIN) IVPB 750 mg     750 mg 100 mL/hr over 90 Minutes Intravenous  Once 12/13/15 1912 12/13/15 2119       Objective: Vitals:   12/13/15 1832 12/13/15 2050 12/14/15 0000 12/14/15 0424  BP: 121/63 136/67 104/61 125/80  Pulse: 102 110 88 93  Resp: 16 17 18 16   Temp: 98.6 F (37 C)  98.2 F (36.8 C) 98.1 F (36.7 C)  TempSrc: Oral  Oral Oral  SpO2: 93% 99% 94% 92%  Weight:   90.3 kg (199 lb 1.2 oz)   Height:   5\' 8"  (1.727 m)     Intake/Output Summary (Last 24 hours) at 12/14/15 1437 Last data  filed at 12/14/15 0432  Gross per 24 hour  Intake              360 ml  Output                0 ml  Net              360 ml   Filed Weights   12/13/15 1445 12/14/15 0000  Weight: 83.5 kg (184 lb) 90.3 kg (199 lb 1.2 oz)    Examination: General exam: Appears comfortable  HEENT: PERRLA, oral mucosa moist, no sclera icterus or thrush Respiratory system: Clear to auscultation. Respiratory effort normal. Cardiovascular system: S1 & S2 heard, RRR.  No murmurs  Gastrointestinal system: Abdomen soft, non-tender, nondistended. Normal bowel sound. No organomegaly Central nervous system: Alert and oriented. No focal neurological deficits. Extremities: No cyanosis, clubbing or edema Skin: No rashes or ulcers Psychiatry:  Mood & affect appropriate.     Data Reviewed: I have personally reviewed following labs and imaging studies  CBC:  Recent Labs Lab 12/13/15 1510 12/14/15 0230  WBC 18.3* 14.2*  HGB 9.8* 8.2*  HCT 31.0* 25.9*  MCV 84.2 83.8  PLT 480* 0000000   Basic Metabolic Panel:  Recent Labs Lab 12/13/15 1510 12/14/15 0230  NA 134* 134*  K 4.4 3.5  CL 101 107  CO2 26 21*  GLUCOSE 128*  127*  BUN 13 10  CREATININE 0.94 0.78  CALCIUM 9.1 8.0*   GFR: Estimated Creatinine Clearance: 83.6 mL/min (by C-G formula based on SCr of 0.8 mg/dL). Liver Function Tests:  Recent Labs Lab 12/13/15 1510  AST 18  ALT 18  ALKPHOS 74  BILITOT 0.5  PROT 8.5*  ALBUMIN 3.8    Recent Labs Lab 12/13/15 1510  LIPASE 17   No results for input(s): AMMONIA in the last 168 hours. Coagulation Profile: No results for input(s): INR, PROTIME in the last 168 hours. Cardiac Enzymes: No results for input(s): CKTOTAL, CKMB, CKMBINDEX, TROPONINI in the last 168 hours. BNP (last 3 results) No results for input(s): PROBNP in the last 8760 hours. HbA1C: No results for input(s): HGBA1C in the last 72 hours. CBG: No results for input(s): GLUCAP in the last 168 hours. Lipid Profile: No  results for input(s): CHOL, HDL, LDLCALC, TRIG, CHOLHDL, LDLDIRECT in the last 72 hours. Thyroid Function Tests: No results for input(s): TSH, T4TOTAL, FREET4, T3FREE, THYROIDAB in the last 72 hours. Anemia Panel: No results for input(s): VITAMINB12, FOLATE, FERRITIN, TIBC, IRON, RETICCTPCT in the last 72 hours. Urine analysis:    Component Value Date/Time   COLORURINE YELLOW 12/13/2015 1503   APPEARANCEUR CLOUDY (A) 12/13/2015 1503   LABSPEC 1.023 12/13/2015 1503   PHURINE 5.5 12/13/2015 1503   GLUCOSEU NEGATIVE 12/13/2015 1503   HGBUR TRACE (A) 12/13/2015 1503   BILIRUBINUR NEGATIVE 12/13/2015 1503   KETONESUR NEGATIVE 12/13/2015 1503   PROTEINUR 30 (A) 12/13/2015 1503   NITRITE NEGATIVE 12/13/2015 1503   LEUKOCYTESUR SMALL (A) 12/13/2015 1503   Sepsis Labs: @LABRCNTIP (procalcitonin:4,lacticidven:4) ) Recent Results (from the past 240 hour(s))  Blood Culture (routine x 2)     Status: None (Preliminary result)   Collection Time: 12/13/15  7:30 PM  Result Value Ref Range Status   Specimen Description BLOOD RIGHT ANTECUBITAL  Final   Special Requests BOTTLES DRAWN AEROBIC AND ANAEROBIC 5 CC EACH  Final   Culture   Final    NO GROWTH < 24 HOURS Performed at [facility]    Report Status PENDING  Incomplete  Blood Culture (routine x 2)     Status: None (Preliminary result)   Collection Time: 12/13/15  8:02 PM  Result Value Ref Range Status   Specimen Description BLOOD LEFT ANTECUBITAL  Final   Special Requests BOTTLES DRAWN AEROBIC AND ANAEROBIC 5CC  Final   Culture   Final    NO GROWTH < 24 HOURS Performed at [facility]    Report Status PENDING  Incomplete         Radiology Studies: Dg Chest 2 View  Result Date: 12/13/2015 CLINICAL DATA:  Low-grade fever and left lower posterior chest pain. EXAM: CHEST  2 VIEW COMPARISON:  12/12/2015 FINDINGS: The cardiac silhouette is mildly enlarged. Mediastinal contours appear intact. There is no evidence of  pneumothorax. There are persistent bilateral pleural effusions with bibasilar subsegmental atelectasis. More focal airspace consolidation versus pulmonary mass is seen in the left lower lobe. Osseous structures are without acute abnormality. Soft tissues are grossly normal. IMPRESSION: Airspace consolidation versus pulmonary mass in the left lower lobe. Bilateral pleural effusions with bibasilar atelectasis. Electronically Signed   By: Fidela Salisbury M.D.   On: 12/13/2015 19:13   Ct Head Wo Contrast  Result Date: 12/14/2015 CLINICAL DATA:  Fall 10 days ago with headaches, initial encounter EXAM: CT HEAD WITHOUT CONTRAST TECHNIQUE: Contiguous axial images were obtained from the base of the skull  through the vertex without intravenous contrast. COMPARISON:  None. FINDINGS: Brain: No evidence of acute infarction, hemorrhage, hydrocephalus, extra-axial collection or mass lesion/mass effect. Left basal ganglia calcifications are seen. Vascular: No hyperdense vessel or unexpected calcification. Skull: Within normal limits. Sinuses/Orbits: No acute finding. IMPRESSION: No acute intracranial abnormality noted. Electronically Signed   By: Inez Catalina M.D.   On: 12/14/2015 09:18      Scheduled Meds: . calcium-vitamin D  1 tablet Oral Daily  . cholecalciferol  1,000 Units Oral Daily  . enoxaparin (LOVENOX) injection  40 mg Subcutaneous Q24H  . levofloxacin (LEVAQUIN) IV  750 mg Intravenous Q24H  . oxybutynin  5 mg Oral Daily   Continuous Infusions: . sodium chloride 75 mL/hr at 12/14/15 1150     LOS: 1 day    Time spent in minutes: 54    [place], MD [facility] Pager: [email or web address] Password [facility] 12/14/2015, 2:37 PM

## 2015-12-14 NOTE — Progress Notes (Signed)
Pt had good response to migraine cocktail. Able to get up and walk a lap around the unit on O2. Starting to feel hungry, provided with some clear liquids and told her to speak with the MD in the AM if she feels like trying to upgrade her diet. Hortencia Conradi RN

## 2015-12-15 ENCOUNTER — Encounter (HOSPITAL_COMMUNITY): Payer: Self-pay | Admitting: Pulmonary Disease

## 2015-12-15 ENCOUNTER — Inpatient Hospital Stay (HOSPITAL_COMMUNITY): Payer: BLUE CROSS/BLUE SHIELD

## 2015-12-15 DIAGNOSIS — J948 Other specified pleural conditions: Secondary | ICD-10-CM

## 2015-12-15 DIAGNOSIS — J189 Pneumonia, unspecified organism: Principal | ICD-10-CM

## 2015-12-15 DIAGNOSIS — J869 Pyothorax without fistula: Secondary | ICD-10-CM

## 2015-12-15 LAB — CBC
HEMATOCRIT: 24.2 % — AB (ref 36.0–46.0)
Hemoglobin: 7.7 g/dL — ABNORMAL LOW (ref 12.0–15.0)
MCH: 27 pg (ref 26.0–34.0)
MCHC: 31.8 g/dL (ref 30.0–36.0)
MCV: 84.9 fL (ref 78.0–100.0)
Platelets: 385 10*3/uL (ref 150–400)
RBC: 2.85 MIL/uL — ABNORMAL LOW (ref 3.87–5.11)
RDW: 16 % — AB (ref 11.5–15.5)
WBC: 12.5 10*3/uL — AB (ref 4.0–10.5)

## 2015-12-15 LAB — BODY FLUID CELL COUNT WITH DIFFERENTIAL
Eos, Fluid: 0 %
LYMPHS FL: 15 %
Monocyte-Macrophage-Serous Fluid: 15 % — ABNORMAL LOW (ref 50–90)
NEUTROPHIL FLUID: 70 % — AB (ref 0–25)
WBC FLUID: 3351 uL — AB (ref 0–1000)

## 2015-12-15 LAB — BASIC METABOLIC PANEL
ANION GAP: 4 — AB (ref 5–15)
BUN: 6 mg/dL (ref 6–20)
CALCIUM: 8.1 mg/dL — AB (ref 8.9–10.3)
CO2: 26 mmol/L (ref 22–32)
Chloride: 105 mmol/L (ref 101–111)
Creatinine, Ser: 0.71 mg/dL (ref 0.44–1.00)
GFR calc Af Amer: >60 mL/min (ref >60)
GFR calc non Af Amer: >60 mL/min (ref >60)
GLUCOSE: 114 mg/dL — AB (ref 65–99)
POTASSIUM: 3.6 mmol/L (ref 3.5–5.1)
Sodium: 135 mmol/L (ref 135–145)

## 2015-12-15 LAB — PROTIME-INR
INR: 1.27
Prothrombin Time: 16 seconds — ABNORMAL HIGH (ref 11.4–15.2)

## 2015-12-15 LAB — LACTATE DEHYDROGENASE, PLEURAL OR PERITONEAL FLUID: LD, Fluid: 470 U/L — ABNORMAL HIGH (ref 3–23)

## 2015-12-15 LAB — ABO/RH: ABO/RH(D): O POS

## 2015-12-15 LAB — MRSA PCR SCREENING: MRSA BY PCR: NEGATIVE

## 2015-12-15 LAB — STREP PNEUMONIAE URINARY ANTIGEN: Strep Pneumo Urinary Antigen: NEGATIVE

## 2015-12-15 LAB — APTT: aPTT: 42 seconds — ABNORMAL HIGH (ref 24–36)

## 2015-12-15 LAB — PREPARE RBC (CROSSMATCH)

## 2015-12-15 MED ORDER — HYDROCOD POLST-CPM POLST ER 10-8 MG/5ML PO SUER
5.0000 mL | Freq: Two times a day (BID) | ORAL | Status: DC | PRN
  Administered 2015-12-15: 5 mL via ORAL
  Filled 2015-12-15: qty 5

## 2015-12-15 MED ORDER — LEVALBUTEROL HCL 1.25 MG/0.5ML IN NEBU
1.2500 mg | INHALATION_SOLUTION | Freq: Once | RESPIRATORY_TRACT | Status: AC
  Administered 2015-12-15: 1.25 mg via RESPIRATORY_TRACT
  Filled 2015-12-15 (×2): qty 0.5

## 2015-12-15 MED ORDER — DEXTROSE 5 % IV SOLN
2.0000 g | Freq: Three times a day (TID) | INTRAVENOUS | Status: DC
  Administered 2015-12-15 – 2015-12-19 (×11): 2 g via INTRAVENOUS
  Filled 2015-12-15 (×17): qty 2

## 2015-12-15 MED ORDER — BENZONATATE 100 MG PO CAPS
100.0000 mg | ORAL_CAPSULE | Freq: Three times a day (TID) | ORAL | Status: DC
  Administered 2015-12-15 – 2015-12-22 (×17): 100 mg via ORAL
  Filled 2015-12-15 (×18): qty 1

## 2015-12-15 MED ORDER — VANCOMYCIN HCL IN DEXTROSE 1-5 GM/200ML-% IV SOLN
1000.0000 mg | INTRAVENOUS | Status: AC
  Administered 2015-12-16: 1000 mg via INTRAVENOUS
  Filled 2015-12-15: qty 200

## 2015-12-15 MED ORDER — ONDANSETRON HCL 4 MG/2ML IJ SOLN
4.0000 mg | Freq: Four times a day (QID) | INTRAMUSCULAR | Status: DC | PRN
  Administered 2015-12-20 – 2015-12-22 (×3): 4 mg via INTRAVENOUS
  Filled 2015-12-15 (×4): qty 2

## 2015-12-15 MED ORDER — LEVALBUTEROL HCL 0.63 MG/3ML IN NEBU
0.6300 mg | INHALATION_SOLUTION | Freq: Four times a day (QID) | RESPIRATORY_TRACT | Status: DC | PRN
  Administered 2015-12-15 – 2015-12-16 (×3): 0.63 mg via RESPIRATORY_TRACT
  Filled 2015-12-15 (×3): qty 3

## 2015-12-15 MED ORDER — ACETAMINOPHEN 325 MG PO TABS
650.0000 mg | ORAL_TABLET | Freq: Four times a day (QID) | ORAL | Status: DC | PRN
  Administered 2015-12-15 – 2015-12-21 (×2): 650 mg via ORAL
  Filled 2015-12-15 (×2): qty 2

## 2015-12-15 MED ORDER — SODIUM CHLORIDE 0.9% FLUSH: 10.0000 mL | INTRAVENOUS | Status: DC | PRN

## 2015-12-15 MED ORDER — PANTOPRAZOLE SODIUM 40 MG PO TBEC
40.0000 mg | DELAYED_RELEASE_TABLET | Freq: Every day | ORAL | Status: DC
  Administered 2015-12-15 – 2015-12-22 (×8): 40 mg via ORAL
  Filled 2015-12-15 (×7): qty 1

## 2015-12-15 NOTE — Progress Notes (Signed)
[place] Progress Note Patient Name: GENNELL FRANCA DOB: 07/16/1950 MRN: [id number]   Date of Service  12/15/2015  HPI/Events of Note  Patient c/o headache. ALT and AST normal.   eICU Interventions  Will order Tylenol 650 mg PO Q 6 hours PRN headache.      Intervention Category Intermediate Interventions: Pain - evaluation and management  Sommer,Steven Cornelia Copa 12/15/2015, 8:44 PM

## 2015-12-15 NOTE — Progress Notes (Signed)
Procedure(s) (LRB): VIDEO ASSISTED THORACOSCOPY (Left) Subjective: Patient examined, most recent CT scan and chest x-ray personally reviewed and counseled with patient Results of left thoracentesis microscopic analysis personally reviewed  65 year old previously healthy female nonsmoker with 2 weeks of progressive shortness of breath, left chest discomfort, cough, malaise, and increasing left likely to pleural effusion probable empyema. Under symptoms started she took oral Levaquin as an outpatient without improvement. She has been hospitalized for shortness of breath and weakness and thoracentesis of 500 cc of cloudy fluid was performed today. She still remains short of breath with significant loculated effusion. Thoracic surgical evaluation is been requested for VATS drainage and decortication of left lung.  The patient denies previous surgical procedures other than C-section. No history of chronic lung disease other than some asthma.  Objective: Vital signs in last 24 hours: Temp:  [98.9 F (37.2 C)-99.7 F (37.6 C)] 98.9 F (37.2 C) (08/24 1700) Pulse Rate:  [93-100] 98 (08/24 1430) Cardiac Rhythm: Sinus tachycardia (08/24 1645) Resp:  [0-23] 23 (08/24 1800) BP: (119-138)/(67-78) 130/67 (08/24 1700) SpO2:  [91 %-98 %] 98 % (08/24 1800)  Hemodynamic parameters for last 24 hours:  low-grade fever Sinus tachycardia  Intake/Output from previous day: 08/23 0701 - 08/24 0700 In: 815 [I.V.:815] Out: 1850 [Urine:1850] Intake/Output this shift: No intake/output data recorded.      Physical Exam  General: Acutely ill middle-aged Caucasian female weak HEENT: Normocephalic pupils equal , dentition adequate Neck: Supple without JVD, adenopathy, or bruit Chest: Diminished breath sounds on the left with scattered coarse rhonchi, no tenderness             or deformity Cardiovascular: Regular rate and rhythm, no murmur, no gallop, peripheral pulses             palpable in all  extremities Abdomen:  Soft, nontender, no palpable mass or organomegaly Extremities: Warm, well-perfused, no clubbing cyanosis edema or tenderness,              no venous stasis changes of the legs Rectal/GU: Deferred Neuro: Grossly non--focal and symmetrical throughout Skin: Clean and dry without rash or ulceration   Lab Results:  Recent Labs  12/14/15 0230 12/15/15 0752  WBC 14.2* 12.5*  HGB 8.2* 7.7*  HCT 25.9* 24.2*  PLT 370 385   BMET:  Recent Labs  12/14/15 0230 12/15/15 0752  NA 134* 135  K 3.5 3.6  CL 107 105  CO2 21* 26  GLUCOSE 127* 114*  BUN 10 6  CREATININE 0.78 0.71  CALCIUM 8.0* 8.1*    PT/INR: No results for input(s): LABPROT, INR in the last 72 hours. ABG No results found for: PHART, HCO3, TCO2, ACIDBASEDEF, O2SAT CBG (last 3)  No results for input(s): GLUCAP in the last 72 hours.  Assessment/Plan: S/P Procedure(s) (LRB): VIDEO ASSISTED THORACOSCOPY (Left) Left lower lobe pneumonia with left empyema-plan left VATS for drainage and decortication tomorrow  Anemia of chronic disease-transfuse preop  Allergic to penicillin, continue aztreonam, vancomycin, Levaquin   LOS: 2 days    Tharon Aquas Trigt III 12/15/2015

## 2015-12-15 NOTE — Progress Notes (Signed)
PROGRESS NOTE    Diana Murray  [id number] DOB: 1951/01/12 DOA: 12/13/2015  PCP: Marda Stalker, PA-C   Brief Narrative:  65 y/o female presenting with fever, chills, dry hacking cough and left sided chest and back pain which is worse with movement. She was started on Levaquin by her PCP a day prior to admission for LLL pneumonia.   Subjective: Continues to have pain in her back and chest wall. She has been coughing excessively and has vomiting after a bout of cough. No sputum as of yet. Very short of breath with ambulation. I have noted that her O2 requirement has increased today to 5 L.   Assessment & Plan:   Principal Problem:   CAP (community acquired pneumonia)/ leukocytosis, lactic acidosis, tachycardia, acute hypoxic respiratory failure - LLL CAP- CXR suggestive of mass vs infiltrate- CT ordered to further evaluate for mass- this reveals a large left pleural effusion with loculation-left upper lobe and lingular collapse-  she has b/l consolidation- no mass - she is now requiring 5 L O2 and is more short of breath with exertion - I have contacted PCCM for thoracentesis and have contacted TCTS for possible VATS - Thoracentesis performed today- 500 cc clear fluid obtained - PCCM plans to take over care and transfer her to [facility] which I am in agreement with - WBC improving despite increased effusions - tachycardia and lactic acidosis resolved - cont Levaquin - PCN allergy - strep pneumo negative - blood cx negative  Active Problems:   Chest pain - pleuritic with likely muscular component- Robaxin, K pad- control cough and treat pneumonia   Vomiting - post-tussive cough x 3 yesterday - cont IVF for now as PO intake is poor- control cough  Headache after recent fall - head CT negative- headache improved  Pyuria - no dysuria or increased frequency of micturation- U culture shows 10,000 colonies of gr neg rods  DVT prophylaxis: Lovenox Code Status: Full  code Family Communication:  Disposition Plan: home in 1-2 days Consultants:   none Procedures:   none Antimicrobials:  Anti-infectives    Start     Dose/Rate Route Frequency Ordered Stop   12/15/15 1300  aztreonam (AZACTAM) 2 g in dextrose 5 % 50 mL IVPB     2 g 100 mL/hr over 30 Minutes Intravenous Every 8 hours 12/15/15 1152     12/14/15 2000  levofloxacin (LEVAQUIN) IVPB 750 mg     750 mg 100 mL/hr over 90 Minutes Intravenous Every 24 hours 12/13/15 1923     12/13/15 1915  levofloxacin (LEVAQUIN) IVPB 750 mg     750 mg 100 mL/hr over 90 Minutes Intravenous  Once 12/13/15 1912 12/13/15 2119       Objective: Vitals:   12/14/15 2100 12/15/15 0320 12/15/15 0408 12/15/15 0735  BP: 119/70 129/70    Pulse: 93 100    Resp: 16 16    Temp: 99.7 F (37.6 C) 99.5 F (37.5 C)    TempSrc: Oral Oral    SpO2: 94% 92% 91% 92%  Weight:      Height:        Intake/Output Summary (Last 24 hours) at 12/15/15 1246 Last data filed at 12/15/15 0324  Gross per 24 hour  Intake              815 ml  Output             1850 ml  Net            -  1035 ml   Filed Weights   12/13/15 1445 12/14/15 0000  Weight: 83.5 kg (184 lb) 90.3 kg (199 lb 1.2 oz)    Examination: General exam: Appears comfortable  HEENT: PERRLA, oral mucosa moist, no sclera icterus or thrush Respiratory system: Clear to auscultation. Poor air entry on left lower lung field, crackles in left mid lung field- 92% on 5 L O2 Cardiovascular system: S1 & S2 heard, RRR.  No murmurs  Gastrointestinal system: Abdomen soft, non-tender, nondistended. Normal bowel sound. No organomegaly Central nervous system: Alert and oriented. No focal neurological deficits. Extremities: No cyanosis, clubbing or edema Skin: No rashes or ulcers Psychiatry:  Mood & affect appropriate.     Data Reviewed: I have personally reviewed following labs and imaging studies  CBC:  Recent Labs Lab 12/13/15 1510 12/14/15 0230 12/15/15 0752  WBC  18.3* 14.2* 12.5*  HGB 9.8* 8.2* 7.7*  HCT 31.0* 25.9* 24.2*  MCV 84.2 83.8 84.9  PLT 480* 370 0000000   Basic Metabolic Panel:  Recent Labs Lab 12/13/15 1510 12/14/15 0230 12/15/15 0752  NA 134* 134* 135  K 4.4 3.5 3.6  CL 101 107 105  CO2 26 21* 26  GLUCOSE 128* 127* 114*  BUN 13 10 6   CREATININE 0.94 0.78 0.71  CALCIUM 9.1 8.0* 8.1*   GFR: Estimated Creatinine Clearance: 83.6 mL/min (by C-G formula based on SCr of 0.8 mg/dL). Liver Function Tests:  Recent Labs Lab 12/13/15 1510  AST 18  ALT 18  ALKPHOS 74  BILITOT 0.5  PROT 8.5*  ALBUMIN 3.8    Recent Labs Lab 12/13/15 1510  LIPASE 17   No results for input(s): AMMONIA in the last 168 hours. Coagulation Profile: No results for input(s): INR, PROTIME in the last 168 hours. Cardiac Enzymes: No results for input(s): CKTOTAL, CKMB, CKMBINDEX, TROPONINI in the last 168 hours. BNP (last 3 results) No results for input(s): PROBNP in the last 8760 hours. HbA1C: No results for input(s): HGBA1C in the last 72 hours. CBG: No results for input(s): GLUCAP in the last 168 hours. Lipid Profile: No results for input(s): CHOL, HDL, LDLCALC, TRIG, CHOLHDL, LDLDIRECT in the last 72 hours. Thyroid Function Tests: No results for input(s): TSH, T4TOTAL, FREET4, T3FREE, THYROIDAB in the last 72 hours. Anemia Panel: No results for input(s): VITAMINB12, FOLATE, FERRITIN, TIBC, IRON, RETICCTPCT in the last 72 hours. Urine analysis:    Component Value Date/Time   COLORURINE YELLOW 12/13/2015 1503   APPEARANCEUR CLOUDY (A) 12/13/2015 1503   LABSPEC 1.023 12/13/2015 1503   PHURINE 5.5 12/13/2015 1503   GLUCOSEU NEGATIVE 12/13/2015 1503   HGBUR TRACE (A) 12/13/2015 1503   BILIRUBINUR NEGATIVE 12/13/2015 1503   KETONESUR NEGATIVE 12/13/2015 1503   PROTEINUR 30 (A) 12/13/2015 1503   NITRITE NEGATIVE 12/13/2015 1503   LEUKOCYTESUR SMALL (A) 12/13/2015 1503   Sepsis Labs: @LABRCNTIP (procalcitonin:4,lacticidven:4) ) Recent  Results (from the past 240 hour(s))  Urine culture     Status: Abnormal (Preliminary result)   Collection Time: 12/13/15  3:03 PM  Result Value Ref Range Status   Specimen Description URINE, CLEAN CATCH  Final   Special Requests NONE  Final   Culture 10,000 COLONIES/mL GRAM NEGATIVE RODS (A)  Final   Report Status PENDING  Incomplete  Blood Culture (routine x 2)     Status: None (Preliminary result)   Collection Time: 12/13/15  7:30 PM  Result Value Ref Range Status   Specimen Description BLOOD RIGHT ANTECUBITAL  Final   Special Requests BOTTLES DRAWN  AEROBIC AND ANAEROBIC 5 CC EACH  Final   Culture   Final    NO GROWTH < 24 HOURS Performed at [facility]    Report Status PENDING  Incomplete  Blood Culture (routine x 2)     Status: None (Preliminary result)   Collection Time: 12/13/15  8:02 PM  Result Value Ref Range Status   Specimen Description BLOOD LEFT ANTECUBITAL  Final   Special Requests BOTTLES DRAWN AEROBIC AND ANAEROBIC 5CC  Final   Culture   Final    NO GROWTH < 24 HOURS Performed at [facility]    Report Status PENDING  Incomplete         Radiology Studies: Dg Chest 2 View  Result Date: 12/13/2015 CLINICAL DATA:  Low-grade fever and left lower posterior chest pain. EXAM: CHEST  2 VIEW COMPARISON:  12/12/2015 FINDINGS: The cardiac silhouette is mildly enlarged. Mediastinal contours appear intact. There is no evidence of pneumothorax. There are persistent bilateral pleural effusions with bibasilar subsegmental atelectasis. More focal airspace consolidation versus pulmonary mass is seen in the left lower lobe. Osseous structures are without acute abnormality. Soft tissues are grossly normal. IMPRESSION: Airspace consolidation versus pulmonary mass in the left lower lobe. Bilateral pleural effusions with bibasilar atelectasis. Electronically Signed   By: Fidela Salisbury M.D.   On: 12/13/2015 19:13   Ct Head Wo Contrast  Result Date:  12/14/2015 CLINICAL DATA:  Fall 10 days ago with headaches, initial encounter EXAM: CT HEAD WITHOUT CONTRAST TECHNIQUE: Contiguous axial images were obtained from the base of the skull through the vertex without intravenous contrast. COMPARISON:  None. FINDINGS: Brain: No evidence of acute infarction, hemorrhage, hydrocephalus, extra-axial collection or mass lesion/mass effect. Left basal ganglia calcifications are seen. Vascular: No hyperdense vessel or unexpected calcification. Skull: Within normal limits. Sinuses/Orbits: No acute finding. IMPRESSION: No acute intracranial abnormality noted. Electronically Signed   By: Inez Catalina M.D.   On: 12/14/2015 09:18   Ct Chest W Contrast  Result Date: 12/14/2015 CLINICAL DATA:  LEFT pleuritic pain beginning December 09, 2015, shortness of breath and cough. Follow-up abnormal chest radiograph. EXAM: CT CHEST WITH CONTRAST TECHNIQUE: Multidetector CT imaging of the chest was performed during intravenous contrast administration. CONTRAST:  40mL ISOVUE-300 IOPAMIDOL (ISOVUE-300) INJECTION 61% COMPARISON:  Chest radiograph August 22nd 2017 FINDINGS: Cardiovascular: Heart size is normal. Trace pericardial effusion. Thoracic aorta is normal course and caliber. Though not tailored for evaluation, no central pulmonary embolism. Mediastinum/Nodes: Calcified RIGHT hilar lymph nodes. Fat stranding anterior mediastinal. Multiple sub cm lymph nodes are likely reactive without lymphadenopathy by CT size criteria. Lungs/Pleura: Large lobulated LEFT pleural effusion LEFT lower lobe hypo enhancing consolidation. Associated air bronchograms, no discrete mass. LEFT upper lobe and lingular collapse. Small RIGHT pleural effusion and RIGHT lower lobe hypo enhancing consolidation with air bronchograms. Tiny calcified granulomas RIGHT lower lobe. Tracheobronchial tree is patent. No pneumothorax. Upper Abdomen: 13 mm cyst in spleen. No acute upper abdominal process. Musculoskeletal: Included  soft tissues are normal. No acute osseous process. IMPRESSION: Large LEFT pleural effusion with suspected loculation. Small RIGHT pleural effusion. LEFT greater than RIGHT atelectasis and, suspected pneumonia. Electronically Signed   By: Elon Alas M.D.   On: 12/14/2015 15:42   Dg Chest Port 1 View  Result Date: 12/15/2015 CLINICAL DATA:  Cough and shortness of breath EXAM: PORTABLE CHEST 1 VIEW COMPARISON:  Chest radiograph December 13, 2015; chest CT December 14, 2015 FINDINGS: There is persistent pleural effusion on the left with left  lower lobe consolidation. There is atelectatic change on the right with a rather minimal right pleural effusion. Heart is upper normal in size with pulmonary vascularity within normal limits. No adenopathy. No bone lesions. IMPRESSION: Sizable left pleural effusion with left lower lobe consolidation. Mild right base atelectasis with small right pleural effusion. Stable cardiac silhouette. The degree of consolidation and effusion on the left have increased compared to 2 days prior and appears similar to 1 day prior. Electronically Signed   By: Lowella Grip III M.D.   On: 12/15/2015 12:11      Scheduled Meds: . aztreonam  2 g Intravenous Q8H  . calcium-vitamin D  1 tablet Oral Daily  . cholecalciferol  1,000 Units Oral Daily  . enoxaparin (LOVENOX) injection  40 mg Subcutaneous Q24H  . levofloxacin (LEVAQUIN) IV  750 mg Intravenous Q24H  . oxybutynin  5 mg Oral Daily   Continuous Infusions: . sodium chloride 75 mL/hr at 12/15/15 1000     LOS: 2 days    Time spent in minutes: 71    [place], MD [facility] Pager: [email or web address] Password [facility] 12/15/2015, 12:46 PM

## 2015-12-15 NOTE — Progress Notes (Signed)
Pharmacy Antibiotic Note  Diana Murray is a 65 y.o. female admitted on [id number] with complicated parapneumonic effusion .  Pharmacy has been consulted for azactam dosing.  Plan: Aztreonam 2gm IV q8h Continue levaquin 750mg  IV q24h Follow renal function, cultures, clinical course  Height: 5\' 8"  (172.7 cm) Weight: 199 lb 1.2 oz (90.3 kg) IBW/kg (Calculated) : 63.9  Temp (24hrs), Avg:99.3 F (37.4 C), Min:98.7 F (37.1 C), Max:99.7 F (37.6 C)   Recent Labs Lab 12/13/15 1510 12/13/15 1941 12/13/15 2221 12/14/15 0230 12/14/15 0810 12/15/15 0752  WBC 18.3*  --   --  14.2*  --  12.5*  CREATININE 0.94  --   --  0.78  --  0.71  LATICACIDVEN  --  1.08 3.56*  --  0.5  --     Estimated Creatinine Clearance: 83.6 mL/min (by C-G formula based on SCr of 0.8 mg/dL).    Allergies  Allergen Reactions  . Penicillins Anaphylaxis and Other (See Comments)    Has patient had a PCN reaction causing immediate rash, facial/tongue/throat swelling, SOB or lightheadedness with hypotension: Yes Has patient had a PCN reaction causing severe rash involving mucus membranes or skin necrosis: No Has patient had a PCN reaction that required hospitalization No Has patient had a PCN reaction occurring within the last 10 years: No If all of the above answers are "NO", then may proceed with Cephalosporin use.    Antimicrobials this admission: 8/21 Levaquin >>  8/24 azactam >>  Dose adjustments this admission:   Microbiology results: 8/22  BCx: ngtd 8/22  UCx: <10K colonies, GNR  8/24 pleural fluid:  Thank you for allowing pharmacy to be a part of this patient's care.  Dolly Rias RPh 12/15/2015, 11:58 AM Pager [phone]

## 2015-12-15 NOTE — Procedures (Signed)
Thoracentesis Procedure Note  Pre-operative Diagnosis: complicated parapneumonic effusion  Post-operative Diagnosis: same  Indications:   Procedure Details  Consent: Informed consent was obtained. Risks of the procedure were discussed including: infection, bleeding, pain, pneumothorax.  Under sterile conditions the patient was positioned. Betadine solution and sterile drapes were utilized.  1% buffered lidocaine was used to anesthetize the 4th rib space. Fluid was obtained without any difficulties and minimal blood loss.  A dressing was applied to the wound and wound care instructions were provided.   Findings 525 ml of clear pleural fluid was obtained. A sample was sent to Pathology for cytogenetics, flow, and cell counts, as well as for infection analysis.  Complications:  None; patient tolerated the procedure well.          Condition: stable  Plan A follow up chest x-ray was ordered. Bed Rest for 0 hours. Tylenol 650 mg. for pain.  Attending Attestation: I performed the procedure.  Roselie Awkward, MD [facility] Pager: [phone] Cell: [phone] After 3pm or if no response, call [phone]

## 2015-12-15 NOTE — Progress Notes (Signed)
12/15/15  1525  Called report to [facility] 2 south bed 1. Report given to [facility]

## 2015-12-15 NOTE — Consult Note (Signed)
Name: Diana Murray MRN: [id number] DOB: [birth year]    ADMISSION DATE:  12/13/2015 CONSULTATION DATE:  12/15/15  REFERRING MD :  Dr. Wynelle Cleveland   CHIEF COMPLAINT:  Bilateral Pleural Effusions, L>R   HISTORY OF PRESENT ILLNESS:  65 y/o F, never smoker, with PMH of asthma and seasonal allergies who presnented to [facility] on 8/22 with reports of nausea / vomiting, left rib pain and chills.    On admit [place] 4 days prior to presentation.  She hit her head on the door frame without LOC and was not evaluated at that time. Two days prior to admit, she developed chills, malaise and left sided rib pain.  Initially, she thought it was related to her fall.  While at the beach, she noted that the air vents had mold.  She was seen at her PCP's office with CXR concerning for PNA and was treated with levaquin and tramadol.  She tolerated the first dose of levaquin but on 7/22 she was unable to tolerate oral intake.  Vomited after dose of levaquin prompting evaluation.  Initial ER work up notable for CXR concerning for LLL PNA vs mass & bilateral pleural effusions with bibasilar atelectasis, WBC 18.3 and UA with pyuria (culture pending).  CT of the head was evaluated and negative for acute process.  To further assess CXR findings, a CT of the chest was completed which demonstrated a large left pleural effusion with concern for loculation, small right pleural effusion, L>R atelectasis and suspected PNA.   The patient was admitted per [facility] for community acquired pneumonia and treated with IV levaquin.  Due to CT findings, PCCM consulted for evaluation.   Currently, she reports ongoing left sided chest pain, nausea and vomiting.     PAST MEDICAL HISTORY :   has a past medical history of Asthma.  has no past surgical history on file.   Prior to Admission medications   Medication Sig Start Date End Date Taking? Authorizing Provider  Calcium Carbonate-Vitamin D  (CALCIUM 600+D) 600-400 MG-UNIT tablet Take 1 tablet by mouth daily.   Yes Historical Provider, MD  cholecalciferol (VITAMIN D) 1000 units tablet Take 1,000 Units by mouth daily.   Yes Historical Provider, MD  fluticasone (FLONASE) 50 MCG/ACT nasal spray Place 2 sprays into both nostrils daily as needed for rhinitis.   Yes Historical Provider, MD  levofloxacin (LEVAQUIN) 750 MG tablet Take 750 mg by mouth daily. 12/12/15 12/19/15 Yes Historical Provider, MD  loratadine (CLARITIN) 10 MG tablet Take 10 mg by mouth daily as needed for allergies.   Yes Historical Provider, MD  oxybutynin (DITROPAN-XL) 5 MG 24 hr tablet Take 5 mg by mouth daily.   Yes Historical Provider, MD  traMADol (ULTRAM) 50 MG tablet Take 50 mg by mouth every 6 (six) hours as needed for moderate pain.   Yes Historical Provider, MD   Allergies  Allergen Reactions  . Penicillins Anaphylaxis and Other (See Comments)    Has patient had a PCN reaction causing immediate rash, facial/tongue/throat swelling, SOB or lightheadedness with hypotension: Yes Has patient had a PCN reaction causing severe rash involving mucus membranes or skin necrosis: No Has patient had a PCN reaction that required hospitalization No Has patient had a PCN reaction occurring within the last 10 years: No If all of the above answers are "NO", then may proceed with Cephalosporin use.    FAMILY HISTORY:  family history is not on file.  SOCIAL HISTORY:  reports that she has never smoked. She has never used smokeless tobacco. She reports that she drinks about 1.2 oz of alcohol per week . She reports that she does not use drugs.  REVIEW OF SYSTEMS:   Constitutional: Negative for fever, chills, weight loss, malaise/fatigue and diaphoresis.  HENT: Negative for hearing loss, ear pain, nosebleeds, congestion, sore throat, neck pain, tinnitus and ear discharge.   Eyes: Negative for blurred vision, double vision, photophobia, pain, discharge and redness.    Respiratory: Negative for cough, hemoptysis, sputum production, pleuritic left chest pain, shortness of breath, wheezing and stridor.   Cardiovascular: Negative for chest pain, palpitations, orthopnea, claudication, leg swelling and PND.  Gastrointestinal: Negative for heartburn, nausea, vomiting, abdominal pain, diarrhea, constipation, blood in stool and melena.  Genitourinary: Negative for dysuria, urgency, frequency, hematuria and flank pain.  Musculoskeletal: Negative for myalgias, back pain, joint pain and falls.  Skin: Negative for itching and rash.  Neurological: Negative for dizziness, tingling, tremors, sensory change, speech change, focal weakness, seizures, loss of consciousness, weakness and headaches.  Endo/Heme/Allergies: Negative for environmental allergies and polydipsia. Does not bruise/bleed easily.  SUBJECTIVE:   VITAL SIGNS: Temp:  [98.7 F (37.1 C)-99.7 F (37.6 C)] 99.5 F (37.5 C) (08/24 0320) Pulse Rate:  [93-100] 100 (08/24 0320) Resp:  [16-18] 16 (08/24 0320) BP: (119-129)/(62-70) 129/70 (08/24 0320) SpO2:  [91 %-94 %] 92 % (08/24 0735)  PHYSICAL EXAMINATION: General:  Well developed adult female in NAD  Neuro:  AAOx4, speech clear, MAE  HEENT:  MM pink/moist, good dentition Cardiovascular:  S1S2 rrr, no m/r/g  Lungs:  Even/non-labored, clear on R, diminished on left  Abdomen:  Obese/soft, bsx4 active Musculoskeletal:  No acute deformities  Skin:  Warm/dry, no edema   Recent Labs Lab 12/13/15 1510 12/14/15 0230 12/15/15 0752  NA 134* 134* 135  K 4.4 3.5 3.6  CL 101 107 105  CO2 26 21* 26  BUN 13 10 6   CREATININE 0.94 0.78 0.71  GLUCOSE 128* 127* 114*    Recent Labs Lab 12/13/15 1510 12/14/15 0230 12/15/15 0752  HGB 9.8* 8.2* 7.7*  HCT 31.0* 25.9* 24.2*  WBC 18.3* 14.2* 12.5*  PLT 480* 370 385   Dg Chest 2 View  Result Date: 12/13/2015 CLINICAL DATA:  Low-grade fever and left lower posterior chest pain. EXAM: CHEST  2 VIEW COMPARISON:   12/12/2015 FINDINGS: The cardiac silhouette is mildly enlarged. Mediastinal contours appear intact. There is no evidence of pneumothorax. There are persistent bilateral pleural effusions with bibasilar subsegmental atelectasis. More focal airspace consolidation versus pulmonary mass is seen in the left lower lobe. Osseous structures are without acute abnormality. Soft tissues are grossly normal. IMPRESSION: Airspace consolidation versus pulmonary mass in the left lower lobe. Bilateral pleural effusions with bibasilar atelectasis. Electronically Signed   By: Fidela Salisbury M.D.   On: 12/13/2015 19:13   Ct Head Wo Contrast  Result Date: 12/14/2015 CLINICAL DATA:  Fall 10 days ago with headaches, initial encounter EXAM: CT HEAD WITHOUT CONTRAST TECHNIQUE: Contiguous axial images were obtained from the base of the skull through the vertex without intravenous contrast. COMPARISON:  None. FINDINGS: Brain: No evidence of acute infarction, hemorrhage, hydrocephalus, extra-axial collection or mass lesion/mass effect. Left basal ganglia calcifications are seen. Vascular: No hyperdense vessel or unexpected calcification. Skull: Within normal limits. Sinuses/Orbits: No acute finding. IMPRESSION: No acute intracranial abnormality noted. Electronically Signed   By: Inez Catalina M.D.   On: 12/14/2015 09:18   Ct Chest W  Contrast  Result Date: 12/14/2015 CLINICAL DATA:  LEFT pleuritic pain beginning December 09, 2015, shortness of breath and cough. Follow-up abnormal chest radiograph. EXAM: CT CHEST WITH CONTRAST TECHNIQUE: Multidetector CT imaging of the chest was performed during intravenous contrast administration. CONTRAST:  14mL ISOVUE-300 IOPAMIDOL (ISOVUE-300) INJECTION 61% COMPARISON:  Chest radiograph August 22nd 2017 FINDINGS: Cardiovascular: Heart size is normal. Trace pericardial effusion. Thoracic aorta is normal course and caliber. Though not tailored for evaluation, no central pulmonary embolism.  Mediastinum/Nodes: Calcified RIGHT hilar lymph nodes. Fat stranding anterior mediastinal. Multiple sub cm lymph nodes are likely reactive without lymphadenopathy by CT size criteria. Lungs/Pleura: Large lobulated LEFT pleural effusion LEFT lower lobe hypo enhancing consolidation. Associated air bronchograms, no discrete mass. LEFT upper lobe and lingular collapse. Small RIGHT pleural effusion and RIGHT lower lobe hypo enhancing consolidation with air bronchograms. Tiny calcified granulomas RIGHT lower lobe. Tracheobronchial tree is patent. No pneumothorax. Upper Abdomen: 13 mm cyst in spleen. No acute upper abdominal process. Musculoskeletal: Included soft tissues are normal. No acute osseous process. IMPRESSION: Large LEFT pleural effusion with suspected loculation. Small RIGHT pleural effusion. LEFT greater than RIGHT atelectasis and, suspected pneumonia. Electronically Signed   By: Elon Alas M.D.   On: 12/14/2015 15:42    SIGNIFICANT EVENTS  8/22  Admit with CAP, CT concerning for loculation 8/24  PCCM consulted for evaluation of pleural effusion.  Thora with 500 ml removed, tx to [facility]   STUDIES:  CT Head 8/23 >> negative CT Chest 8/23 >> large left pleural effusion with concern for loculation, small right pleural effusion, L>R atelectasis and suspected PNA.  ANTIBIOTICS: Levaquin 8/22 >>  Aztreonam 8/24 >>   CULTURES: BCx2 8/22 >>  UC 8/22 >>    Pleural Fluid - left 8/24, 510 ml fluid removed GS >> Culture >>  Fungal >> Cell Ct/Diff >> LDH ratio >> Protein >>   ASSESSMENT / PLAN:  Bilateral Pleural Effusions - L>R with concern for loculation on L Community Acquired PNA  Acute Hypoxic Respiratory Failure - in setting of left PNA with large pleural effusion  Plan: Continue levaquin, D3/x abx Add aztreonam Assess at bedside for thoracentesis to evaluate pleural fluid  Send pleural fluid for LDH, protein, culture, GS, fungal, and serum to match Consult CVTS, likely will  need VATS given rapid progression of symptoms Intermittent CXR O2 as needed to support saturations >92% Pulmonary hygiene - IS, mobilize  Transfer to [facility] ICU (2 [place]) Toradol for pleuritic pain  Noe Gens, NP-C [facility] Pgr: [phone] or if no answer [phone] 12/15/2015, 10:32 AM

## 2015-12-15 NOTE — Progress Notes (Signed)
[facility] Progress Note Patient Name: Diana Murray DOB: Apr 01, 1951 MRN: [id number]   Date of Service  12/15/2015  HPI/Events of Note  Notified on need for DVT and stress ulcer prophylaxis.   eICU Interventions  Will order: 1. Protonix PO. 2. SCD's.     Intervention Category Intermediate Interventions: Best-practice therapies (e.g. DVT, beta blocker, etc.)  Rome Schlauch Eugene 12/15/2015, 7:38 PM

## 2015-12-16 ENCOUNTER — Inpatient Hospital Stay (HOSPITAL_COMMUNITY): Payer: BLUE CROSS/BLUE SHIELD | Admitting: Certified Registered"

## 2015-12-16 ENCOUNTER — Encounter (HOSPITAL_COMMUNITY): Payer: Self-pay | Admitting: Anesthesiology

## 2015-12-16 ENCOUNTER — Encounter (HOSPITAL_COMMUNITY)
Admission: EM | Disposition: A | Discharge status: Home Health | Payer: Self-pay | Source: Home / Self Care | Attending: Pulmonary Disease

## 2015-12-16 ENCOUNTER — Inpatient Hospital Stay (HOSPITAL_COMMUNITY): Payer: BLUE CROSS/BLUE SHIELD

## 2015-12-16 DIAGNOSIS — R51 Headache: Secondary | ICD-10-CM

## 2015-12-16 DIAGNOSIS — J90 Pleural effusion, not elsewhere classified: Secondary | ICD-10-CM

## 2015-12-16 HISTORY — PX: VIDEO ASSISTED THORACOSCOPY: SHX5073

## 2015-12-16 HISTORY — PX: CHEST TUBE INSERTION: SHX231

## 2015-12-16 LAB — BLOOD GAS, ARTERIAL
Acid-Base Excess: 0.5 mmol/L (ref 0.0–2.0)
Bicarbonate: 24.4 mEq/L — ABNORMAL HIGH (ref 20.0–24.0)
FIO2: 40
O2 Saturation: 95.6 %
Patient temperature: 98.6
TCO2: 25.6 mmol/L (ref 0–100)
pCO2 arterial: 38.1 mmHg (ref 35.0–45.0)
pH, Arterial: 7.422 (ref 7.350–7.450)
pO2, Arterial: 76.6 mmHg — ABNORMAL LOW (ref 80.0–100.0)

## 2015-12-16 LAB — RENAL FUNCTION PANEL
ALBUMIN: 2.2 g/dL — AB (ref 3.5–5.0)
Anion gap: 9 (ref 5–15)
CHLORIDE: 102 mmol/L (ref 101–111)
CO2: 26 mmol/L (ref 22–32)
CREATININE: 0.64 mg/dL (ref 0.44–1.00)
Calcium: 8.3 mg/dL — ABNORMAL LOW (ref 8.9–10.3)
GFR calc Af Amer: >60 mL/min (ref >60)
GLUCOSE: 135 mg/dL — AB (ref 65–99)
POTASSIUM: 3.5 mmol/L (ref 3.5–5.1)
Phosphorus: 2.9 mg/dL (ref 2.5–4.6)
Sodium: 137 mmol/L (ref 135–145)

## 2015-12-16 LAB — CBC WITH DIFFERENTIAL/PLATELET
Basophils Absolute: 0 10*3/uL (ref 0.0–0.1)
Basophils Relative: 0 %
EOS ABS: 0.2 10*3/uL (ref 0.0–0.7)
EOS PCT: 1 %
HCT: 28.7 % — ABNORMAL LOW (ref 36.0–46.0)
Hemoglobin: 9.1 g/dL — ABNORMAL LOW (ref 12.0–15.0)
LYMPHS ABS: 1.2 10*3/uL (ref 0.7–4.0)
LYMPHS PCT: 10 %
MCH: 26.8 pg (ref 26.0–34.0)
MCHC: 31.7 g/dL (ref 30.0–36.0)
MCV: 84.4 fL (ref 78.0–100.0)
MONO ABS: 2 10*3/uL — AB (ref 0.1–1.0)
Monocytes Relative: 16 %
Neutro Abs: 8.7 10*3/uL — ABNORMAL HIGH (ref 1.7–7.7)
Neutrophils Relative %: 73 %
PLATELETS: 362 10*3/uL (ref 150–400)
RBC: 3.4 MIL/uL — ABNORMAL LOW (ref 3.87–5.11)
RDW: 15.6 % — AB (ref 11.5–15.5)
WBC: 12 10*3/uL — AB (ref 4.0–10.5)

## 2015-12-16 LAB — GRAM STAIN

## 2015-12-16 LAB — URINE CULTURE

## 2015-12-16 LAB — PROCALCITONIN

## 2015-12-16 LAB — ACID FAST SMEAR (AFB, MYCOBACTERIA): Acid Fast Smear: NEGATIVE

## 2015-12-16 LAB — PROTEIN, BODY FLUID: Total protein, fluid: 3.8 g/dL

## 2015-12-16 LAB — MAGNESIUM: MAGNESIUM: 1.9 mg/dL (ref 1.7–2.4)

## 2015-12-16 SURGERY — VIDEO ASSISTED THORACOSCOPY
Anesthesia: General | Site: Chest | Laterality: Right

## 2015-12-16 MED ORDER — PHENYLEPHRINE 40 MCG/ML (10ML) SYRINGE FOR IV PUSH (FOR BLOOD PRESSURE SUPPORT)
PREFILLED_SYRINGE | INTRAVENOUS | Status: AC
  Filled 2015-12-16: qty 20

## 2015-12-16 MED ORDER — POTASSIUM CHLORIDE 10 MEQ/50ML IV SOLN
10.0000 meq | Freq: Every day | INTRAVENOUS | Status: DC | PRN
  Administered 2015-12-19 (×3): 10 meq via INTRAVENOUS
  Filled 2015-12-16 (×3): qty 50

## 2015-12-16 MED ORDER — BUPIVACAINE HCL (PF) 0.5 % IJ SOLN
INTRAMUSCULAR | Status: DC | PRN
  Administered 2015-12-16: 10 mL

## 2015-12-16 MED ORDER — OXYCODONE HCL 5 MG PO TABS
5.0000 mg | ORAL_TABLET | ORAL | Status: DC | PRN
  Administered 2015-12-21 (×2): 10 mg via ORAL
  Filled 2015-12-16 (×2): qty 2

## 2015-12-16 MED ORDER — SUGAMMADEX SODIUM 200 MG/2ML IV SOLN
INTRAVENOUS | Status: DC | PRN
  Administered 2015-12-16: 180.6 mg via INTRAVENOUS

## 2015-12-16 MED ORDER — VANCOMYCIN HCL IN DEXTROSE 1-5 GM/200ML-% IV SOLN
INTRAVENOUS | Status: AC
  Filled 2015-12-16: qty 200

## 2015-12-16 MED ORDER — FENTANYL CITRATE (PF) 100 MCG/2ML IJ SOLN
INTRAMUSCULAR | Status: AC
  Filled 2015-12-16: qty 2

## 2015-12-16 MED ORDER — FENTANYL CITRATE (PF) 100 MCG/2ML IJ SOLN
INTRAMUSCULAR | Status: DC | PRN
  Administered 2015-12-16: 100 ug via INTRAVENOUS
  Administered 2015-12-16: 50 ug via INTRAVENOUS
  Administered 2015-12-16: 150 ug via INTRAVENOUS
  Administered 2015-12-16 (×2): 50 ug via INTRAVENOUS

## 2015-12-16 MED ORDER — BUPIVACAINE 0.5 % ON-Q PUMP SINGLE CATH 400 ML
400.0000 mL | INJECTION | Status: DC
  Filled 2015-12-16: qty 400

## 2015-12-16 MED ORDER — MORPHINE SULFATE (PF) 2 MG/ML IV SOLN
1.0000 mg | INTRAVENOUS | Status: DC | PRN
  Administered 2015-12-16 (×2): 4 mg via INTRAVENOUS
  Administered 2015-12-19 – 2015-12-21 (×6): 2 mg via INTRAVENOUS
  Filled 2015-12-16: qty 1
  Filled 2015-12-16: qty 2
  Filled 2015-12-16 (×5): qty 1
  Filled 2015-12-16: qty 2

## 2015-12-16 MED ORDER — SODIUM CHLORIDE 0.9% FLUSH
10.0000 mL | INTRAVENOUS | Status: DC | PRN
  Administered 2015-12-16: 10 mL

## 2015-12-16 MED ORDER — LEVALBUTEROL HCL 0.63 MG/3ML IN NEBU
0.6300 mg | INHALATION_SOLUTION | Freq: Four times a day (QID) | RESPIRATORY_TRACT | Status: DC
  Administered 2015-12-16 – 2015-12-17 (×2): 0.63 mg via RESPIRATORY_TRACT
  Filled 2015-12-16 (×3): qty 3

## 2015-12-16 MED ORDER — HYDROMORPHONE HCL 1 MG/ML IJ SOLN
INTRAMUSCULAR | Status: AC
  Filled 2015-12-16: qty 1

## 2015-12-16 MED ORDER — DEXTROSE 5 % IV SOLN
2.0000 g | INTRAVENOUS | Status: AC
  Administered 2015-12-16: 2 g via INTRAVENOUS
  Filled 2015-12-16 (×2): qty 2

## 2015-12-16 MED ORDER — PROPOFOL 10 MG/ML IV BOLUS
INTRAVENOUS | Status: AC
  Filled 2015-12-16: qty 20

## 2015-12-16 MED ORDER — BUPIVACAINE ON-Q PAIN PUMP (FOR ORDER SET NO CHG)
INJECTION | Status: AC
  Filled 2015-12-16: qty 1

## 2015-12-16 MED ORDER — EPHEDRINE 5 MG/ML INJ
INTRAVENOUS | Status: AC
  Filled 2015-12-16: qty 10

## 2015-12-16 MED ORDER — TRAMADOL HCL 50 MG PO TABS
50.0000 mg | ORAL_TABLET | Freq: Four times a day (QID) | ORAL | Status: DC | PRN
  Administered 2015-12-18 – 2015-12-19 (×2): 50 mg via ORAL
  Filled 2015-12-16 (×2): qty 1

## 2015-12-16 MED ORDER — MIDAZOLAM HCL 5 MG/5ML IJ SOLN
INTRAMUSCULAR | Status: DC | PRN
  Administered 2015-12-16: 2 mg via INTRAVENOUS

## 2015-12-16 MED ORDER — DEXTROSE 5 % IV SOLN
1.0000 g | INTRAVENOUS | Status: DC
  Filled 2015-12-16: qty 1

## 2015-12-16 MED ORDER — ACETAMINOPHEN 500 MG PO TABS
1000.0000 mg | ORAL_TABLET | Freq: Four times a day (QID) | ORAL | Status: DC
  Administered 2015-12-16 – 2015-12-21 (×16): 1000 mg via ORAL
  Filled 2015-12-16 (×14): qty 2

## 2015-12-16 MED ORDER — ROCURONIUM BROMIDE 100 MG/10ML IV SOLN
INTRAVENOUS | Status: DC | PRN
  Administered 2015-12-16: 50 mg via INTRAVENOUS

## 2015-12-16 MED ORDER — MIDAZOLAM HCL 2 MG/2ML IJ SOLN
INTRAMUSCULAR | Status: AC
  Filled 2015-12-16: qty 2

## 2015-12-16 MED ORDER — ACETAMINOPHEN 160 MG/5ML PO SOLN
1000.0000 mg | Freq: Four times a day (QID) | ORAL | Status: DC
  Filled 2015-12-16: qty 40.6

## 2015-12-16 MED ORDER — BUPIVACAINE 0.5 % ON-Q PUMP SINGLE CATH 400 ML
INJECTION | Status: DC | PRN
  Administered 2015-12-16: 400 mL

## 2015-12-16 MED ORDER — SODIUM CHLORIDE 0.9% FLUSH
10.0000 mL | Freq: Two times a day (BID) | INTRAVENOUS | Status: DC
  Administered 2015-12-17 – 2015-12-18 (×3): 10 mL

## 2015-12-16 MED ORDER — ROCURONIUM BROMIDE 10 MG/ML (PF) SYRINGE
PREFILLED_SYRINGE | INTRAVENOUS | Status: AC
  Filled 2015-12-16: qty 10

## 2015-12-16 MED ORDER — FENTANYL 40 MCG/ML IV SOLN
INTRAVENOUS | Status: DC
  Administered 2015-12-16: 20:00:00 via INTRAVENOUS
  Administered 2015-12-17: 30 ug via INTRAVENOUS
  Administered 2015-12-17: 75 ug via INTRAVENOUS
  Administered 2015-12-17: 150 ug via INTRAVENOUS
  Filled 2015-12-16: qty 25

## 2015-12-16 MED ORDER — POTASSIUM CHLORIDE IN NACL 20-0.45 MEQ/L-% IV SOLN
INTRAVENOUS | Status: DC
Start: 2015-12-16 — End: 2015-12-17
  Administered 2015-12-16: 100 mL/h via INTRAVENOUS
  Filled 2015-12-16 (×2): qty 1000

## 2015-12-16 MED ORDER — METOCLOPRAMIDE HCL 5 MG/ML IJ SOLN
10.0000 mg | Freq: Four times a day (QID) | INTRAMUSCULAR | Status: AC
  Administered 2015-12-16 – 2015-12-17 (×3): 10 mg via INTRAVENOUS
  Filled 2015-12-16 (×3): qty 2

## 2015-12-16 MED ORDER — ONDANSETRON HCL 4 MG/2ML IJ SOLN
INTRAMUSCULAR | Status: DC | PRN
  Administered 2015-12-16: 4 mg via INTRAVENOUS

## 2015-12-16 MED ORDER — MIDAZOLAM HCL 2 MG/2ML IJ SOLN
1.0000 mg | Freq: Once | INTRAMUSCULAR | Status: AC
Start: 2015-12-16 — End: 2015-12-16
  Administered 2015-12-16: 1 mg via INTRAVENOUS

## 2015-12-16 MED ORDER — DIPHENHYDRAMINE HCL 12.5 MG/5ML PO ELIX
12.5000 mg | ORAL_SOLUTION | Freq: Four times a day (QID) | ORAL | Status: DC | PRN
Start: 2015-12-16 — End: 2015-12-17

## 2015-12-16 MED ORDER — LACTATED RINGERS IV SOLN
INTRAVENOUS | Status: DC
  Administered 2015-12-16 – 2015-12-17 (×2): via INTRAVENOUS

## 2015-12-16 MED ORDER — SENNOSIDES-DOCUSATE SODIUM 8.6-50 MG PO TABS
1.0000 | ORAL_TABLET | Freq: Every day | ORAL | Status: DC
Start: 2015-12-16 — End: 2015-12-22
  Administered 2015-12-16 – 2015-12-21 (×6): 1 via ORAL
  Filled 2015-12-16 (×7): qty 1

## 2015-12-16 MED ORDER — BUPIVACAINE HCL (PF) 0.5 % IJ SOLN
INTRAMUSCULAR | Status: AC
  Filled 2015-12-16: qty 10

## 2015-12-16 MED ORDER — TALC 5 G PL SUSR
INTRAPLEURAL | Status: AC
  Filled 2015-12-16: qty 5

## 2015-12-16 MED ORDER — DIPHENHYDRAMINE HCL 50 MG/ML IJ SOLN: 12.5000 mg | Freq: Four times a day (QID) | INTRAMUSCULAR | Status: DC | PRN

## 2015-12-16 MED ORDER — ONDANSETRON HCL 4 MG/2ML IJ SOLN: 4.0000 mg | Freq: Once | INTRAMUSCULAR | Status: DC | PRN

## 2015-12-16 MED ORDER — ONDANSETRON HCL 4 MG/2ML IJ SOLN
INTRAMUSCULAR | Status: AC
  Filled 2015-12-16: qty 2

## 2015-12-16 MED ORDER — ONDANSETRON HCL 4 MG/2ML IJ SOLN: 4.0000 mg | Freq: Four times a day (QID) | INTRAMUSCULAR | Status: DC | PRN

## 2015-12-16 MED ORDER — SODIUM CHLORIDE 0.9% FLUSH: 9.0000 mL | INTRAVENOUS | Status: DC | PRN

## 2015-12-16 MED ORDER — FENTANYL CITRATE (PF) 100 MCG/2ML IJ SOLN: 25.0000 ug | INTRAMUSCULAR | Status: DC | PRN

## 2015-12-16 MED ORDER — NALOXONE HCL 0.4 MG/ML IJ SOLN: 0.4000 mg | INTRAMUSCULAR | Status: DC | PRN

## 2015-12-16 MED ORDER — LACTATED RINGERS IV SOLN
INTRAVENOUS | Status: DC | PRN
  Administered 2015-12-16: 15:00:00 via INTRAVENOUS

## 2015-12-16 MED ORDER — LIDOCAINE 2% (20 MG/ML) 5 ML SYRINGE
INTRAMUSCULAR | Status: AC
  Filled 2015-12-16: qty 5

## 2015-12-16 MED ORDER — MEPERIDINE HCL 25 MG/ML IJ SOLN: 6.2500 mg | INTRAMUSCULAR | Status: DC | PRN

## 2015-12-16 MED ORDER — FENTANYL CITRATE (PF) 250 MCG/5ML IJ SOLN
INTRAMUSCULAR | Status: AC
  Filled 2015-12-16: qty 5

## 2015-12-16 MED ORDER — VANCOMYCIN HCL IN DEXTROSE 1-5 GM/200ML-% IV SOLN
1000.0000 mg | Freq: Two times a day (BID) | INTRAVENOUS | Status: AC
  Administered 2015-12-17: 1000 mg via INTRAVENOUS
  Filled 2015-12-16: qty 200

## 2015-12-16 MED ORDER — HYDROMORPHONE HCL 1 MG/ML IJ SOLN
0.2500 mg | INTRAMUSCULAR | Status: DC | PRN
  Administered 2015-12-16: 0.5 mg via INTRAVENOUS

## 2015-12-16 MED ORDER — 0.9 % SODIUM CHLORIDE (POUR BTL) OPTIME
TOPICAL | Status: DC | PRN
  Administered 2015-12-16: 2000 mL

## 2015-12-16 MED ORDER — BISACODYL 5 MG PO TBEC
10.0000 mg | DELAYED_RELEASE_TABLET | Freq: Every day | ORAL | Status: DC
  Administered 2015-12-17 – 2015-12-22 (×6): 10 mg via ORAL
  Filled 2015-12-16 (×7): qty 2

## 2015-12-16 MED ORDER — SUGAMMADEX SODIUM 200 MG/2ML IV SOLN
INTRAVENOUS | Status: AC
  Filled 2015-12-16: qty 2

## 2015-12-16 MED ORDER — PROPOFOL 10 MG/ML IV BOLUS
INTRAVENOUS | Status: DC | PRN
  Administered 2015-12-16: 150 mg via INTRAVENOUS

## 2015-12-16 MED ORDER — BUPIVACAINE HCL (PF) 0.75 % IJ SOLN
INTRAMUSCULAR | Status: AC
  Filled 2015-12-16: qty 10

## 2015-12-16 MED ORDER — LIDOCAINE HCL (CARDIAC) 20 MG/ML IV SOLN
INTRAVENOUS | Status: DC | PRN
  Administered 2015-12-16: 50 mg via INTRAVENOUS

## 2015-12-16 SURGICAL SUPPLY — 62 items
BAG DECANTER FOR FLEXI CONT (MISCELLANEOUS) IMPLANT
BLADE SURG 11 STRL SS (BLADE) ×4 IMPLANT
CANISTER SUCTION 2500CC (MISCELLANEOUS) ×4 IMPLANT
CATH KIT ON Q 5IN SLV (PAIN MANAGEMENT) IMPLANT
CATH KIT ON-Q SILVERSOAK 5IN (CATHETERS) ×4 IMPLANT
CATH ROBINSON RED A/P 22FR (CATHETERS) IMPLANT
CATH THORACIC 28FR (CATHETERS) ×4 IMPLANT
CATH THORACIC 36FR (CATHETERS) IMPLANT
CATH THORACIC 36FR RT ANG (CATHETERS) IMPLANT
CONT SPEC 4OZ CLIKSEAL STRL BL (MISCELLANEOUS) ×8 IMPLANT
COVER SURGICAL LIGHT HANDLE (MISCELLANEOUS) ×8 IMPLANT
DERMABOND ADVANCED (GAUZE/BANDAGES/DRESSINGS) ×2
DERMABOND ADVANCED .7 DNX12 (GAUZE/BANDAGES/DRESSINGS) ×2 IMPLANT
DRAIN CHANNEL 28F RND 3/8 FF (WOUND CARE) ×4 IMPLANT
DRAIN CHANNEL 32F RND 10.7 FF (WOUND CARE) ×4 IMPLANT
DRAPE LAPAROSCOPIC ABDOMINAL (DRAPES) ×4 IMPLANT
DRAPE WARM FLUID 44X44 (DRAPE) ×4 IMPLANT
ELECT REM PT RETURN 9FT ADLT (ELECTROSURGICAL) ×4
ELECTRODE REM PT RTRN 9FT ADLT (ELECTROSURGICAL) ×2 IMPLANT
GAUZE SPONGE 4X4 12PLY STRL (GAUZE/BANDAGES/DRESSINGS) ×8 IMPLANT
GLOVE BIO SURGEON STRL SZ7.5 (GLOVE) ×8 IMPLANT
GOWN STRL REUS W/ TWL LRG LVL3 (GOWN DISPOSABLE) ×6 IMPLANT
GOWN STRL REUS W/TWL LRG LVL3 (GOWN DISPOSABLE) ×6
KIT BASIN OR (CUSTOM PROCEDURE TRAY) ×4 IMPLANT
KIT ROOM TURNOVER OR (KITS) ×4 IMPLANT
KIT SUCTION CATH 14FR (SUCTIONS) ×4 IMPLANT
NS IRRIG 1000ML POUR BTL (IV SOLUTION) ×8 IMPLANT
PACK CHEST (CUSTOM PROCEDURE TRAY) ×4 IMPLANT
PAD ARMBOARD 7.5X6 YLW CONV (MISCELLANEOUS) ×8 IMPLANT
SEALANT SURG COSEAL 4ML (VASCULAR PRODUCTS) IMPLANT
SOLUTION ANTI FOG 6CC (MISCELLANEOUS) ×4 IMPLANT
SPONGE TONSIL 1 RF SGL (DISPOSABLE) ×12 IMPLANT
SUT CHROMIC 3 0 SH 27 (SUTURE) IMPLANT
SUT ETHILON 3 0 PS 1 (SUTURE) IMPLANT
SUT PROLENE 3 0 SH DA (SUTURE) IMPLANT
SUT PROLENE 4 0 RB 1 (SUTURE)
SUT PROLENE 4-0 RB1 .5 CRCL 36 (SUTURE) IMPLANT
SUT SILK  1 MH (SUTURE) ×6
SUT SILK 1 MH (SUTURE) ×6 IMPLANT
SUT SILK 2 0SH CR/8 30 (SUTURE) IMPLANT
SUT SILK 3 0 SH 30 (SUTURE) ×8 IMPLANT
SUT SILK 3 0SH CR/8 30 (SUTURE) IMPLANT
SUT VIC AB 1 CTX 18 (SUTURE) ×8 IMPLANT
SUT VIC AB 2 TP1 27 (SUTURE) IMPLANT
SUT VIC AB 2-0 CT2 18 VCP726D (SUTURE) IMPLANT
SUT VIC AB 2-0 CTX 36 (SUTURE) ×4 IMPLANT
SUT VIC AB 3-0 SH 18 (SUTURE) IMPLANT
SUT VIC AB 3-0 X1 27 (SUTURE) ×4 IMPLANT
SUT VICRYL 0 UR6 27IN ABS (SUTURE) IMPLANT
SUT VICRYL 2 TP 1 (SUTURE) IMPLANT
SWAB COLLECTION DEVICE MRSA (MISCELLANEOUS) ×4 IMPLANT
SWAB CULTURE ESWAB REG 1ML (MISCELLANEOUS) ×4 IMPLANT
SYSTEM SAHARA CHEST DRAIN ATS (WOUND CARE) ×4 IMPLANT
TAPE CLOTH SURG 4X10 WHT LF (GAUZE/BANDAGES/DRESSINGS) ×8 IMPLANT
TIP APPLICATOR SPRAY EXTEND 16 (VASCULAR PRODUCTS) IMPLANT
TOWEL OR 17X24 6PK STRL BLUE (TOWEL DISPOSABLE) ×4 IMPLANT
TOWEL OR 17X26 10 PK STRL BLUE (TOWEL DISPOSABLE) ×8 IMPLANT
TRAP SPECIMEN MUCOUS 40CC (MISCELLANEOUS) ×4 IMPLANT
TRAY FOLEY CATH 16FRSI W/METER (SET/KITS/TRAYS/PACK) ×4 IMPLANT
TUBE ANAEROBIC SPECIMEN COL (MISCELLANEOUS) IMPLANT
TUNNELER SHEATH ON-Q 11GX8 DSP (PAIN MANAGEMENT) ×4 IMPLANT
WATER STERILE IRR 1000ML POUR (IV SOLUTION) ×8 IMPLANT

## 2015-12-16 NOTE — Brief Op Note (Signed)
12/13/2015 - 12/16/2015  5:08 PM  PATIENT:  Diana Murray  65 y.o. female  PRE-OPERATIVE DIAGNOSIS:  LEFT EMPYEMA  POST-OPERATIVE DIAGNOSIS:  left empyema  PROCEDURE:  Procedure(s):  LEFT VIDEO ASSISTED THORACOSCOPY/ MINI THORACOTOMY - Drainage of pleural effusion -Decortication of pleural peel -Insertion of ON-Q Anesthetic Catheter  SURGEON:  Surgeon(s) and Role:    * Ivin Poot, MD - Primary  PHYSICIAN ASSISTANT: Ellwood Handler PA-C  ANESTHESIA:   general  EBL:  Total I/O In: -  Out: 500 [Urine:500]  BLOOD ADMINISTERED:none  DRAINS: 32 [address] Blake Drain x 2   LOCAL MEDICATIONS USED:  MARCAINE     SPECIMEN:  Source of Specimen:  Pleual Fluid, Pleural Peel  DISPOSITION OF SPECIMEN:  Cytology, Pathology, Microbiology  COUNTS:  YES  TOURNIQUET:  * No tourniquets in log *  DICTATION: .Dragon Dictation  PLAN OF CARE: Admit to inpatient   PATIENT DISPOSITION:  ICU - intubated and hemodynamically stable.   Delay start of Pharmacological VTE agent (>24hrs) due to surgical blood loss or risk of bleeding: yes

## 2015-12-16 NOTE — Progress Notes (Signed)
RN attempted to put patient on room air for ABG that was ordered. Patient immediately desated to high 70s after oxygen was turned off. RN had to place pt on 5L [place] for ABG.

## 2015-12-16 NOTE — Progress Notes (Addendum)
Name: Diana Murray MRN: [id number] DOB: [birth year]    ADMISSION DATE:  12/13/2015 CONSULTATION DATE:  12/15/15  REFERRING MD :  Dr. Wynelle Cleveland   CHIEF COMPLAINT:  Bilateral Pleural Effusions, L>R   HISTORY OF PRESENT ILLNESS:  65 y/o F, never smoker, with PMH of asthma and seasonal allergies who presnented to [facility] on 8/22 with reports of nausea / vomiting, left rib pain and chills.  On admit she [place] approximately 4 days prior to presentation.  She hit her head on the door frame without LOC and was not evaluated at that time. Two days prior to admit, she developed chills, malaise and left sided rib pain.  Initially, she thought it was related to her fall.  While at the beach, she noted that the air vents had mold.  She was seen at her PCP's office with CXR concerning for PNA and was treated with levaquin and tramadol.  She tolerated the first dose of levaquin but on 7/22 she was unable to tolerate oral intake.  Vomited after dose of levaquin prompting evaluation.  Initial ER work up notable for CXR concerning for LLL PNA vs mass & bilateral pleural effusions with bibasilar atelectasis, WBC 18.3 and UA with pyuria (culture pending).  CT of the head was evaluated and negative for acute process.  To further assess CXR findings, a CT of the chest was completed which demonstrated a large left pleural effusion with concern for loculation, small right pleural effusion, L>R atelectasis and suspected PNA. The patient was admitted per [facility] for community acquired pneumonia and treated with IV levaquin.  Due to CT findings, PCCM consulted for evaluation. Currently, she reports ongoing left sided chest pain, nausea and vomiting.     SUBJECTIVE: No acute events overnight. Patient planned for VATS today. Patient reports she does have left sided chest wall & shoulder pain that she rates as 6 out of 10. Continues to have baseline dyspnea as well as intermittent nonproductive  cough.  REVIEW OF SYSTEMS:  Does report some mild nausea. Denies any abdominal pain or discomfort. Denies any subjective fever, chills, or sweats. Does have a headache that she rates as 8/10. No vision changes.  VITAL SIGNS: Temp:  [98.2 F (36.8 C)-99.4 F (37.4 C)] 98.4 F (36.9 C) (08/25 0356) Pulse Rate:  [98] 98 (08/24 1430) Resp:  [0-26] 23 (08/25 0247) BP: (109-138)/(56-100) 134/77 (08/25 0247) SpO2:  [92 %-100 %] 94 % (08/25 0247)  PHYSICAL EXAMINATION: General:  Awake. Alert. No acute distress. Sitting watching TV.   Integument:  Warm & dry. No rash on exposed skin.  HEENT:  Dry mucus membranes. No oral ulcers. No scleral injection or icterus. Cardiovascular:  Regular rate. No edema. No appreciable JVD.  Pulmonary:  No accessory muscle use on nasal cannula oxygen. Decreased breath sounds bilateral lung bases particularly on the left. Abdomen: Soft. Normal bowel sounds. Nondistended. Grossly nontender. Musculoskeletal:  Normal bulk and tone. No joint deformity or effusion appreciated. Neurological: Cranial nerves II-12 grossly intact. No meningismus. Moving all 4 extremities equally.   Recent Labs Lab 12/13/15 1510 12/14/15 0230 12/15/15 0752  NA 134* 134* 135  K 4.4 3.5 3.6  CL 101 107 105  CO2 26 21* 26  BUN 13 10 6   CREATININE 0.94 0.78 0.71  GLUCOSE 128* 127* 114*    Recent Labs Lab 12/13/15 1510 12/14/15 0230 12/15/15 0752  HGB 9.8* 8.2* 7.7*  HCT 31.0* 25.9* 24.2*  WBC 18.3* 14.2*  12.5*  PLT 480* 370 385   Ct Head Wo Contrast  Result Date: 12/14/2015 CLINICAL DATA:  Fall 10 days ago with headaches, initial encounter EXAM: CT HEAD WITHOUT CONTRAST TECHNIQUE: Contiguous axial images were obtained from the base of the skull through the vertex without intravenous contrast. COMPARISON:  None. FINDINGS: Brain: No evidence of acute infarction, hemorrhage, hydrocephalus, extra-axial collection or mass lesion/mass effect. Left basal ganglia calcifications are  seen. Vascular: No hyperdense vessel or unexpected calcification. Skull: Within normal limits. Sinuses/Orbits: No acute finding. IMPRESSION: No acute intracranial abnormality noted. Electronically Signed   By: Inez Catalina M.D.   On: 12/14/2015 09:18   Ct Chest W Contrast  Result Date: 12/14/2015 CLINICAL DATA:  LEFT pleuritic pain beginning December 09, 2015, shortness of breath and cough. Follow-up abnormal chest radiograph. EXAM: CT CHEST WITH CONTRAST TECHNIQUE: Multidetector CT imaging of the chest was performed during intravenous contrast administration. CONTRAST:  44mL ISOVUE-300 IOPAMIDOL (ISOVUE-300) INJECTION 61% COMPARISON:  Chest radiograph August 22nd 2017 FINDINGS: Cardiovascular: Heart size is normal. Trace pericardial effusion. Thoracic aorta is normal course and caliber. Though not tailored for evaluation, no central pulmonary embolism. Mediastinum/Nodes: Calcified RIGHT hilar lymph nodes. Fat stranding anterior mediastinal. Multiple sub cm lymph nodes are likely reactive without lymphadenopathy by CT size criteria. Lungs/Pleura: Large lobulated LEFT pleural effusion LEFT lower lobe hypo enhancing consolidation. Associated air bronchograms, no discrete mass. LEFT upper lobe and lingular collapse. Small RIGHT pleural effusion and RIGHT lower lobe hypo enhancing consolidation with air bronchograms. Tiny calcified granulomas RIGHT lower lobe. Tracheobronchial tree is patent. No pneumothorax. Upper Abdomen: 13 mm cyst in spleen. No acute upper abdominal process. Musculoskeletal: Included soft tissues are normal. No acute osseous process. IMPRESSION: Large LEFT pleural effusion with suspected loculation. Small RIGHT pleural effusion. LEFT greater than RIGHT atelectasis and, suspected pneumonia. Electronically Signed   By: Elon Alas M.D.   On: 12/14/2015 15:42   Dg Chest Port 1 View  Result Date: 12/15/2015 CLINICAL DATA:  Cough and shortness of breath EXAM: PORTABLE CHEST 1 VIEW COMPARISON:   Chest radiograph December 13, 2015; chest CT December 14, 2015 FINDINGS: There is persistent pleural effusion on the left with left lower lobe consolidation. There is atelectatic change on the right with a rather minimal right pleural effusion. Heart is upper normal in size with pulmonary vascularity within normal limits. No adenopathy. No bone lesions. IMPRESSION: Sizable left pleural effusion with left lower lobe consolidation. Mild right base atelectasis with small right pleural effusion. Stable cardiac silhouette. The degree of consolidation and effusion on the left have increased compared to 2 days prior and appears similar to 1 day prior. Electronically Signed   By: Lowella Grip III M.D.   On: 12/15/2015 12:11    SIGNIFICANT EVENTS  8/22 - Admit with CAP, CT concerning for loculation 8/24 - PCCM consulted for evaluation of pleural effusion.  Thora with 500 ml removed, tx to [facility]   STUDIES:  CT Head 8/23: negative CT Chest 8/23: large left pleural effusion with concern for loculation, small right pleural effusion, L>R atelectasis and suspected PNA.  ANTIBIOTICS: Levaquin 8/22 >>  Aztreonam 8/24 >>   MICROBIOLOGY: MRSA PCR 8/24:  Negative Blood Ctx 8/22 >> Urine Ctx 8/22:  E coli L Pleural Effusion Ctx 8/24 >>  Left Pleural Fluid (510 ml) GS:  No organisms Cell Count:  3351 (70% neutro, 15% lymph, 15% monocyte) LDH:  470 Protein:  3.8 (8.5) Cytology>>  LINES/TUBES: PIV x1  ASSESSMENT / PLAN:  PULMONARY A: Severe CAP Acute Hypoxic Respiratory Failure - Secondary to Severe CAP Bilateral Pleural Effusions/Exudative Left Effusion - L>R. S/P Thora 8/24. H/O Asthma  P: Wean FiO2 for Sat >92% VATS w/ CVTS today Continuous Pulse Oximetry Tussionex & Tessalon perles for cough suppression Incentive Spirometry  INFECTIOUS A: Severe CAP Parapneumonic Effusion E coli UTI  P: Levaquin Day #4 & Aztreonam Day #2 Awaiting finalization of cultures Trending Procalcitonin  per algorithm Checking Urine Strep & Legionella Antigens  CARDIOVASCULAR A: No acute issues.  P: Continuous telemetry monitoring  Vitals per unit protocol  RENAL A: No acute issue.  P: Trending UOP Monitoring electrolytes & renal function daily Replacing electrolytes as indicated  HEMATOLOGY/ONCOLOGY A: Anemia - No signs of active bleeding.  Leukocytosis - Likely secondary to sepsis/infection.  P: Trending cell counts daily w/ CBC SCDs Lovenox [place] daily Transfusing PRBC per CVTS  GASTROENTEROLOGY A: No acute issue.  P: NPO  NEUROLOGICAL A: Pleurisy/Chest Pain - Secondary to effusion.  P: Increasing Morphine IV prn  ENDOCRINE A: Hyperglycemia - No h/o DM. Mild.  P: Monitor serum glucose on daily labs.   FAMILY DISCUSSION:  No family at bedside. Patient updated 8/25 by Dr. Ashok Cordia.  TODAY'S SUMMARY:  65 y.o. with bilateral pleural effusions left greater than right as well as loculation within the left pleural effusion. Patient does have community acquired pneumonia. Underwent thoracentesis yesterday with finding of discordant exudative pleural effusion consistent with a parapneumonic effusion. Patient planned for VATS by thoracic surgery today. I'm increasing the patient's dose of morphine today and a better effort to control her headache and chest wall pain.   Sonia Baller Ashok Cordia, M.D. [facility]monary [facility] Pager:  [phone] After 3pm or if no response, call [phone] 12/16/2015, 6:27 AM

## 2015-12-16 NOTE — Anesthesia Procedure Notes (Signed)
Procedure Name: Intubation Date/Time: 12/16/2015 3:21 PM Performed by: Eligha Bridegroom Pre-anesthesia Checklist: Emergency Drugs available, Patient identified, Suction available, Patient being monitored and Timeout performed Patient Re-evaluated:Patient Re-evaluated prior to inductionOxygen Delivery Method: Circle system utilized Preoxygenation: Pre-oxygenation with 100% oxygen Intubation Type: IV induction Ventilation: Mask ventilation without difficulty and Oral airway inserted - appropriate to patient size Laryngoscope Size: Mac and 3 Grade View: Grade II Endobronchial tube: Left, EBT position confirmed by fiberoptic bronchoscope and Double lumen EBT and 37 Fr Number of attempts: 1 Airway Equipment and Method: Stylet Placement Confirmation: ETT inserted through vocal cords under direct vision,  positive ETCO2 and breath sounds checked- equal and bilateral Tube secured with: Tape Dental Injury: Teeth and Oropharynx as per pre-operative assessment

## 2015-12-16 NOTE — Anesthesia Preprocedure Evaluation (Signed)
Anesthesia Evaluation  Patient identified by MRN, date of birth, ID band Patient awake    Reviewed: Allergy & Precautions, NPO status , Patient's Chart, lab work & pertinent test results  Airway Mallampati: I  TM Distance: >3 FB Neck ROM: Full    Dental   Pulmonary shortness of breath, asthma , pneumonia,    Pulmonary exam normal        Cardiovascular Normal cardiovascular exam     Neuro/Psych    GI/Hepatic   Endo/Other    Renal/GU      Musculoskeletal   Abdominal   Peds  Hematology   Anesthesia Other Findings   Reproductive/Obstetrics                             Anesthesia Physical Anesthesia Plan  ASA: III  Anesthesia Plan: General   Post-op Pain Management:    Induction: Intravenous  Airway Management Planned: Double Lumen EBT  Additional Equipment: Arterial line, CVP and Ultrasound Guidance Line Placement  Intra-op Plan:   Post-operative Plan: Extubation in OR  Informed Consent: I have reviewed the patients History and Physical, chart, labs and discussed the procedure including the risks, benefits and alternatives for the proposed anesthesia with the patient or authorized representative who has indicated his/her understanding and acceptance.     Plan Discussed with: CRNA and Surgeon  Anesthesia Plan Comments:         Anesthesia Quick Evaluation

## 2015-12-16 NOTE — Transfer of Care (Signed)
Immediate Anesthesia Transfer of Care Note  Patient: Diana Murray  Procedure(s) Performed: Procedure(s): Left VIDEO ASSISTED THORACOSCOPY (Left) CHEST TUBE INSERTION right (Right)  Patient Location: PACU  Anesthesia Type:General  Level of Consciousness: awake and alert   Airway & Oxygen Therapy: Patient Spontanous Breathing and Patient connected to face mask oxygen  Post-op Assessment: Report given to RN and Post -op Vital signs reviewed and stable  Post vital signs: Reviewed and stable  Last Vitals:  Vitals:   12/16/15 0921 12/16/15 1754  BP:    Pulse:    Resp:    Temp: 37.3 C 36.3 C    Last Pain:  Vitals:   12/16/15 1754  TempSrc:   PainSc: Asleep      Patients Stated Pain Goal: 3 ([id number])  Complications: No apparent anesthesia complications

## 2015-12-16 NOTE — Progress Notes (Signed)
Procedure(s) (LRB): VIDEO ASSISTED THORACOSCOPY (Left) Subjective: Patient remains on 6 L oxygen Chest x-ray shows persistent moderate to large left pleural effusion status post thoracentesis She remains very symptomatic We'll proceed with left VATS and drainage decortication of left empyema later today Preop anemia improved after 1 unit packed cells Objective: Vital signs in last 24 hours: Temp:  [98.2 F (36.8 C)-99.4 F (37.4 C)] 98.4 F (36.9 C) (08/25 0356) Pulse Rate:  [98] 98 (08/24 1430) Cardiac Rhythm: Sinus tachycardia (08/25 0756) Resp:  [0-26] 18 (08/25 0700) BP: (109-139)/(56-100) 135/61 (08/25 0700) SpO2:  [92 %-100 %] 95 % (08/25 0700)  Hemodynamic parameters for last 24 hours:   Stable Intake/Output from previous day: 08/24 0701 - 08/25 0700 In: 2196.3 [I.V.:1631.3; Blood:365; IV Piggyback:200] Out: 1900 [id number] Intake/Output this shift: No intake/output data recorded.  Diminished breath sounds at left base  Lab Results:  Recent Labs  12/15/15 0752 12/16/15 0754  WBC 12.5* 12.0*  HGB 7.7* 9.1*  HCT 24.2* 28.7*  PLT 385 362   BMET:  Recent Labs  12/14/15 0230 12/15/15 0752  NA 134* 135  K 3.5 3.6  CL 107 105  CO2 21* 26  GLUCOSE 127* 114*  BUN 10 6  CREATININE 0.78 0.71  CALCIUM 8.0* 8.1*    PT/INR:  Recent Labs  12/15/15 2315  LABPROT 16.0*  INR 1.27   ABG    Component Value Date/Time   PHART 7.422 12/15/2015 2355   HCO3 24.4 (H) 12/15/2015 2355   TCO2 25.6 12/15/2015 2355   O2SAT 95.6 12/15/2015 2355   CBG (last 3)  No results for input(s): GLUCAP in the last 72 hours.  Assessment/Plan: S/P Procedure(s) (LRB): VIDEO ASSISTED THORACOSCOPY (Left) 4 OR today-left VATS   LOS: 3 days    Tharon Aquas Trigt III 12/16/2015

## 2015-12-17 ENCOUNTER — Inpatient Hospital Stay (HOSPITAL_COMMUNITY): Payer: BLUE CROSS/BLUE SHIELD

## 2015-12-17 DIAGNOSIS — J9601 Acute respiratory failure with hypoxia: Secondary | ICD-10-CM

## 2015-12-17 DIAGNOSIS — J9811 Atelectasis: Secondary | ICD-10-CM

## 2015-12-17 DIAGNOSIS — J869 Pyothorax without fistula: Secondary | ICD-10-CM

## 2015-12-17 LAB — CBC WITH DIFFERENTIAL/PLATELET
BASOS ABS: 0 10*3/uL (ref 0.0–0.1)
Basophils Relative: 0 %
EOS ABS: 0 10*3/uL (ref 0.0–0.7)
EOS PCT: 0 %
HCT: 29.5 % — ABNORMAL LOW (ref 36.0–46.0)
Hemoglobin: 9.3 g/dL — ABNORMAL LOW (ref 12.0–15.0)
LYMPHS ABS: 1 10*3/uL (ref 0.7–4.0)
LYMPHS PCT: 6 %
MCH: 26.9 pg (ref 26.0–34.0)
MCHC: 31.5 g/dL (ref 30.0–36.0)
MCV: 85.3 fL (ref 78.0–100.0)
MONO ABS: 2.3 10*3/uL — AB (ref 0.1–1.0)
Monocytes Relative: 14 %
Neutro Abs: 13.6 10*3/uL — ABNORMAL HIGH (ref 1.7–7.7)
Neutrophils Relative %: 80 %
PLATELETS: 382 10*3/uL (ref 150–400)
RBC: 3.46 MIL/uL — ABNORMAL LOW (ref 3.87–5.11)
RDW: 15.7 % — AB (ref 11.5–15.5)
WBC: 16.9 10*3/uL — AB (ref 4.0–10.5)

## 2015-12-17 LAB — POCT I-STAT 3, ART BLOOD GAS (G3+)
BICARBONATE: 25.8 meq/L — AB (ref 20.0–24.0)
O2 SAT: 92 %
PCO2 ART: 47.8 mmHg — AB (ref 35.0–45.0)
PH ART: 7.347 — AB (ref 7.350–7.450)
PO2 ART: 72 mmHg — AB (ref 80.0–100.0)
TCO2: 27 mmol/L (ref 0–100)

## 2015-12-17 LAB — RENAL FUNCTION PANEL
Albumin: 1.8 g/dL — ABNORMAL LOW (ref 3.5–5.0)
Anion gap: 9 (ref 5–15)
CHLORIDE: 102 mmol/L (ref 101–111)
CO2: 25 mmol/L (ref 22–32)
CREATININE: 0.71 mg/dL (ref 0.44–1.00)
Calcium: 7.8 mg/dL — ABNORMAL LOW (ref 8.9–10.3)
GFR calc Af Amer: >60 mL/min (ref >60)
GLUCOSE: 144 mg/dL — AB (ref 65–99)
POTASSIUM: 3.3 mmol/L — AB (ref 3.5–5.1)
Phosphorus: 3.6 mg/dL (ref 2.5–4.6)
Sodium: 136 mmol/L (ref 135–145)

## 2015-12-17 LAB — PROCALCITONIN: Procalcitonin: 0.18 ng/mL

## 2015-12-17 LAB — ACID FAST SMEAR (AFB, MYCOBACTERIA): Acid Fast Smear: NEGATIVE

## 2015-12-17 LAB — MAGNESIUM: MAGNESIUM: 1.7 mg/dL (ref 1.7–2.4)

## 2015-12-17 MED ORDER — POTASSIUM CHLORIDE 10 MEQ/50ML IV SOLN
10.0000 meq | INTRAVENOUS | Status: AC
  Administered 2015-12-17 (×3): 10 meq via INTRAVENOUS
  Filled 2015-12-17 (×2): qty 50

## 2015-12-17 MED ORDER — DIPHENHYDRAMINE HCL 12.5 MG/5ML PO ELIX: 12.5000 mg | ORAL_SOLUTION | Freq: Four times a day (QID) | ORAL | Status: DC | PRN

## 2015-12-17 MED ORDER — NALOXONE HCL 0.4 MG/ML IJ SOLN: 0.4000 mg | INTRAMUSCULAR | Status: DC | PRN

## 2015-12-17 MED ORDER — SODIUM CHLORIDE 0.9% FLUSH: 9.0000 mL | INTRAVENOUS | Status: DC | PRN

## 2015-12-17 MED ORDER — FENTANYL 40 MCG/ML IV SOLN
INTRAVENOUS | Status: DC
  Administered 2015-12-17: 90 ug via INTRAVENOUS
  Administered 2015-12-17: 30 ug via INTRAVENOUS
  Administered 2015-12-17: 60 ug via INTRAVENOUS
  Administered 2015-12-18: 30 ug via INTRAVENOUS
  Administered 2015-12-18: 90 ug via INTRAVENOUS
  Administered 2015-12-18: 135 ug via INTRAVENOUS
  Administered 2015-12-18: 30 ug via INTRAVENOUS
  Administered 2015-12-18 – 2015-12-19 (×2): 15 ug via INTRAVENOUS
  Administered 2015-12-19: 75 ug via INTRAVENOUS
  Administered 2015-12-19: 07:00:00 via INTRAVENOUS
  Administered 2015-12-19 (×2): 30 ug via INTRAVENOUS
  Administered 2015-12-19: 0 ug via INTRAVENOUS
  Administered 2015-12-20 (×2): 10 ug via INTRAVENOUS
  Administered 2015-12-20: 24 mL via INTRAVENOUS
  Filled 2015-12-17: qty 25

## 2015-12-17 MED ORDER — DIPHENHYDRAMINE HCL 50 MG/ML IJ SOLN: 12.5000 mg | Freq: Four times a day (QID) | INTRAMUSCULAR | Status: DC | PRN

## 2015-12-17 MED ORDER — ONDANSETRON HCL 4 MG/2ML IJ SOLN: 4.0000 mg | Freq: Four times a day (QID) | INTRAMUSCULAR | Status: DC | PRN

## 2015-12-17 NOTE — Progress Notes (Signed)
1 Day Post-Op Procedure(s) (LRB): Left VIDEO ASSISTED THORACOSCOPY (Left) CHEST TUBE INSERTION right (Right) Subjective:  Sore. Was up in a chair for a few hrs this am. Doing 250-500 on IS  Objective: Vital signs in last 24 hours: Temp:  [97.3 F (36.3 C)-101 F (38.3 C)] 98.7 F (37.1 C) (08/26 0734) Pulse Rate:  [100-108] 100 (08/25 2000) Cardiac Rhythm: Sinus tachycardia (08/26 0800) Resp:  [18-40] 25 (08/26 1111) BP: (93-137)/(49-76) 116/60 (08/26 0800) SpO2:  [88 %-100 %] 94 % (08/26 1111) Arterial Line BP: (71-200)/(47-84) 71/62 (08/26 0800) FiO2 (%):  [0 %-100 %] 0 % (08/26 1111)  Hemodynamic parameters for last 24 hours:    Intake/Output from previous day: 08/25 0701 - 08/26 0700 In: 3755 [P.O.:480; I.V.:2675; IV Piggyback:600] Out: 1980 [Urine:1510; Chest Tube:470] Intake/Output this shift: Total I/O In: 60 [I.V.:10; IV Piggyback:50] Out: 100 [Urine:100]  General appearance: slowed mentation Heart: regular rate and rhythm, S1, S2 normal, no murmur, click, rub or gallop Lungs: diminished breath sounds bibasilar Wound: dressing dry chest tube output is serous and low volume. no air leaks  Lab Results:  Recent Labs  12/16/15 0754 12/17/15 0352  WBC 12.0* 16.9*  HGB 9.1* 9.3*  HCT 28.7* 29.5*  PLT 362 382   BMET:  Recent Labs  12/16/15 0754 12/17/15 0352  NA 137 136  K 3.5 3.3*  CL 102 102  CO2 26 25  GLUCOSE 135* 144*  BUN <5* <5*  CREATININE 0.64 0.71  CALCIUM 8.3* 7.8*    PT/INR:  Recent Labs  12/15/15 2315  LABPROT 16.0*  INR 1.27   ABG    Component Value Date/Time   PHART 7.347 (L) 12/17/2015 0353   HCO3 25.8 (H) 12/17/2015 0353   TCO2 27 12/17/2015 0353   O2SAT 92.0 12/17/2015 0353   CBG (last 3)  No results for input(s): GLUCAP in the last 72 hours.  CLINICAL DATA:  Post thoracoscopy  EXAM: PORTABLE CHEST 1 VIEW  COMPARISON:  December 16, 2015  FINDINGS: The heart size and mediastinal contours are stable. The heart  size is enlarged. Right central venous line and bilateral chest tubes are unchanged. There is interval developed patchy consolidation throughout the left lung with relative sparing of the left apex. Patchy consolidation of the medial perihilar right lung is also identified. There is no pleural line to suggest pneumothorax. The visualized skeletal structures are stable.  IMPRESSION: Interval developed patchy consolidation throughout the left lung with relative sparing of the left apex probably due to a combination of increasing pleural effusion and underlying pulmonary consolidation question pneumonia. Increased patchy consolidation of the right perihilar region pneumonia is not excluded.   Electronically Signed   By: Abelardo Diesel M.D.   On: 12/17/2015 07:50      Assessment/Plan: S/P Procedure(s) (LRB): Left VIDEO ASSISTED THORACOSCOPY (Left) CHEST TUBE INSERTION right (Right)  Stable s/p left thoracotomy for drainage of empyema. Cultures pending. CXR looks worse today probably due to atelectasis from no breathing as deep.  Continue chest tubes to suction Work on IS, OOB. antibiotics  CCM is managing otherwise   LOS: 4 days    Gaye Pollack 12/17/2015

## 2015-12-17 NOTE — Op Note (Signed)
NAMECATE, KOPER NO.:  [id number]  MEDICAL RECORD NO.:  [id number]  LOCATION:  2S01C                        FACILITY:  [facility]  PHYSICIAN:  Ivin Poot, M.D.  DATE OF BIRTH:  23-Aug-1950  DATE OF PROCEDURE:  12/16/2015 DATE OF DISCHARGE:                              OPERATIVE REPORT   OPERATION: 1. Left VATS, drainage of empyema and decortication of left lower     lobe. 2. Placement of right chest tube for right pleural effusion.  SURGEON:  Ivin Poot, MD.  ASSISTANT:  Providence Crosby, PA-C.  PREOPERATIVE DIAGNOSES: 1. Left lower lobe pneumonia with empyema, subacute respiratory     insufficiency. 2. Small to moderate right pleural effusion.  POSTOPERATIVE DIAGNOSES: 1. Left lower lobe pneumonia with empyema, subacute respiratory     insufficiency. 2. Small to moderate right pleural effusion.  ANESTHESIA:  General by Dr. Lillia Abed.  CLINICAL NOTE:  The patient is a 65 year old Caucasian female, nonsmoker, presented with subacute progressive respiratory illness with cough, shortness of breath, pleuritic left chest pain, intermittent low- grade fever, malaise, weight loss, and abnormal chest x-ray.  CT scan showed a large left effusion with compression-atelectasis of the left lower lobe.  A thoracentesis removed only 500 mL and there was still a significant residual effusion with significant symptomatic shortness of breath.  VATS was recommended by her pulmonary doctors.  I saw the patient in consultation and agreed with recommendation for left VATS for drainage of loculated effusion and decortication of the lower lobe.  I discussed the procedure in detail with the patient and family including the use of general anesthesia, location of her surgical incision, the expected use of postoperative chest tube drainage system, and potential complications, including, recurrent infection, pneumothorax, air leak, respiratory failure requiring prolonged  ventilation, bleeding requiring transfusion, and death.  She demonstrated her understanding, agreed to proceed with surgery, and what I felt was an informed consent.  FINDINGS: 1. 1 L loculated pleural effusion in the left pleural space     successfully drained. 2. Left lower lobe pleural peel recently formed removed with     decortication. 3. Intraoperative drop in oxygen saturation with collapse of the left     lung probably related to a right pleural effusion documented on     postoperative chest x-ray treated with a separate right chest tube     while under anesthesia.  OPERATIVE PROCEDURE:  The patient was brought to the operating room, placed supine on the operating table.  General anesthesia was induced under invasive hemodynamic monitoring.  A double-lumen endotracheal tube was positioned by the anesthesiologist.  The patient was then positioned left side up.  The left side had been marked.  The patient was prepped and draped as a sterile field.  A proper time-out was performed.  A small incision was made in the fifth interspace anterior to the tip of the scapula.  The pleural space was opened.  The left lung had been collapsed by the Anesthesia team.  A large amount of serosanguineous fluid was drained.  This was sent for both culture and cytology.  The scope was inserted.  There was large amount of adhesions in  the pleural space.  These were taken down.  The incision was lengthened slightly in order to accomplish mobilization of the left upper lobe and left lower lobe.  There were several large pockets of fluid which were all drained as the adhesions were cleared out.  There is a fibrinous peel on the left lower lobe which was carefully dissected off without injuring the lung.  There are collections of jelly like solid material in the pleural space as well.  These were all removed and the space was irrigated.  Periods of drop in oxygen saturation were treated by  intermittently inflating and ventilating the left lung.  The anesthesiologist was present to assist with this situation.  After the successful drainage of loculated effusion, two Blake drains were placed superiorly and inferiorly in left pleural space.  These were placed while the lungs were inflated to help with oxygenation.  They were secured to the skin.  The incision was closed with a single pericostal #2 Vicryl suture followed by interrupted #1 Vicryl for the muscle layers, a running subcuticular Vicryl, and a running subcutaneous Vicryl as well.  An ON-Q catheter was placed between the chest tube exit sites and the main incision and secured to the skin and connected to a Marcaine 0.5% reservoir .  Sterile dressings were placed.  The patient was then rolled supine after the dressings were then placed and the chest tube was connected to the Pleur-evac drainage chamber.  A chest x-ray was taken to the operating room with the patient still intubated asleep.  This showed a small-moderate right pleural effusion. Because of the difficulty with oxygenation during the procedure, I decided to put a right chest tube (28-French) in the right pleural space to drain this effusion while the patient was still asleep.  This was accomplished and a separate Pleur-evac was connected to the chest tube. A sterile dressing was placed around the new tube and the patient was then extubated and returned to recovery room.     Ivin Poot, M.D.     PV/MEDQ  D:  12/17/2015  T:  12/17/2015  Job:  [id number]  cc:   Javier Glazier

## 2015-12-17 NOTE — Progress Notes (Signed)
K+= 3.3 and creat= 0.76 w/ urine o/p > 30cc/hr; TCTS KCL protocol initiated with 10 mEq KCL in 50cc IV x 3, each over one hour.

## 2015-12-17 NOTE — Progress Notes (Signed)
Patient ID: Diana Murray, female   DOB: August 09, 1950, 65 y.o.   MRN: [id number]  SICU Evening Rounds:  Hemodynamically stable today.  Working on pulmonary toilet  Chest tube output remains low.  Cultures remain negative so far.

## 2015-12-17 NOTE — Progress Notes (Signed)
Name: Diana Murray MRN: [id number] DOB: [birth year]    ADMISSION DATE:  12/13/2015 CONSULTATION DATE:  12/15/15  REFERRING MD :  Dr. Wynelle Cleveland   CHIEF COMPLAINT:  Bilateral Pleural Effusions, L>R   HISTORY OF PRESENT ILLNESS:  65 y/o F, never smoker, with PMH of asthma and seasonal allergies who presnented to [facility] on 8/22 with reports of nausea / vomiting, left rib pain and chills.  On admit she [place] the beach and [place] approximately 4 days prior to presentation.  She hit her head on the door frame without LOC and was not evaluated at that time. Two days prior to admit, she developed chills, malaise and left sided rib pain.  Initially, she thought it was related to her fall.  While at the beach, she noted that the air vents had mold.  She was seen at her PCP's office with CXR concerning for PNA and was treated with levaquin and tramadol.  She tolerated the first dose of levaquin but on 7/22 she was unable to tolerate oral intake.  Vomited after dose of levaquin prompting evaluation.  Initial ER work up notable for CXR concerning for LLL PNA vs mass & bilateral pleural effusions with bibasilar atelectasis, WBC 18.3 and UA with pyuria (culture pending).  CT of the head was evaluated and negative for acute process.  To further assess CXR findings, a CT of the chest was completed which demonstrated a large left pleural effusion with concern for loculation, small right pleural effusion, L>R atelectasis and suspected PNA. The patient was admitted per [facility] for community acquired pneumonia and treated with IV levaquin.  Due to CT findings, PCCM consulted for evaluation. Currently, she reports ongoing left sided chest pain, nausea and vomiting.     SUBJECTIVE: No acute events overnight. Patient planned for VATS today. Patient reports she does have left sided chest wall & shoulder pain that she rates as 6 out of 10. Continues to have baseline dyspnea as well as intermittent nonproductive  cough.  REVIEW OF SYSTEMS:  Does report some mild nausea. Denies any abdominal pain or discomfort. Denies any subjective fever, chills, or sweats. Does have a headache that she rates as 8/10. No vision changes.  VITAL SIGNS: Temp:  [97.3 F (36.3 C)-101 F (38.3 C)] 98.7 F (37.1 C) (08/26 0734) Pulse Rate:  [100-108] 100 (08/25 2000) Resp:  [13-40] 22 (08/26 0800) BP: (93-137)/(49-76) 100/53 (08/26 0700) SpO2:  [88 %-100 %] 90 % (08/26 0800) Arterial Line BP: (91-200)/(47-84) 116/61 (08/26 0700) FiO2 (%):  [0 %-100 %] 0 % (08/26 0800)  PHYSICAL EXAMINATION: General:  Awake. Alert. No acute distress. Sitting watching TV.   Integument:  Warm & dry. No rash on exposed skin.  HEENT:  Dry mucus membranes. No oral ulcers. No scleral injection or icterus. Cardiovascular:  Regular rate. No edema. No appreciable JVD.  Pulmonary:  No accessory muscle use on nasal cannula oxygen. Decreased breath sounds bilateral lung bases particularly on the left. Abdomen: Soft. Normal bowel sounds. Nondistended. Grossly nontender. Musculoskeletal:  Normal bulk and tone. No joint deformity or effusion appreciated. Neurological: Cranial nerves II-12 grossly intact. No meningismus. Moving all 4 extremities equally.   Recent Labs Lab 12/15/15 0752 12/16/15 0754 12/17/15 0352  NA 135 137 136  K 3.6 3.5 3.3*  CL 105 102 102  CO2 26 26 25   BUN 6 <5* <5*  CREATININE 0.71 0.64 0.71  GLUCOSE 114* 135* 144*    Recent Labs Lab 12/15/15 0752 12/16/15  0754 12/17/15 0352  HGB 7.7* 9.1* 9.3*  HCT 24.2* 28.7* 29.5*  WBC 12.5* 12.0* 16.9*  PLT 385 362 382   Dg Chest Port 1 View  Result Date: 12/17/2015 CLINICAL DATA:  Post thoracoscopy EXAM: PORTABLE CHEST 1 VIEW COMPARISON:  December 16, 2015 FINDINGS: The heart size and mediastinal contours are stable. The heart size is enlarged. Right central venous line and bilateral chest tubes are unchanged. There is interval developed patchy consolidation throughout  the left lung with relative sparing of the left apex. Patchy consolidation of the medial perihilar right lung is also identified. There is no pleural line to suggest pneumothorax. The visualized skeletal structures are stable. IMPRESSION: Interval developed patchy consolidation throughout the left lung with relative sparing of the left apex probably due to a combination of increasing pleural effusion and underlying pulmonary consolidation question pneumonia. Increased patchy consolidation of the right perihilar region pneumonia is not excluded. Electronically Signed   By: Abelardo Diesel M.D.   On: 12/17/2015 07:50   Dg Chest Port 1 View  Result Date: 12/16/2015 CLINICAL DATA:  Followup exam.  Chest tube in place. EXAM: PORTABLE CHEST 1 VIEW COMPARISON:  12/16/2015 at 1711 hours FINDINGS: Endotracheal tube has been removed since the prior exam. The 2 left-sided chest tubes are stable. New right inferior hemi thorax chest tube. Tip projects just above the right hemidiaphragm. Side hole of the tube projects just outside the thoracic cage. There has been a significant decrease in right pleural fluid following the right chest tube placement. No convincing pneumothorax. Left lung base opacity is similar to the prior study. Stable right internal jugular central venous line. IMPRESSION: 1. New right inferior hemi thorax chest tube. There has been a significant decrease in right pleural fluid with clearing of much of the right lung base opacity. No convincing pneumothorax. 2. No change in the 2 left-sided chest tubes or in the appearance of the left lung. 3. Status post removal of the endotracheal tube. Electronically Signed   By: Lajean Manes M.D.   On: 12/16/2015 18:31   Dg Chest Portable 1 View  Result Date: 12/16/2015 CLINICAL DATA:  Pole effusion versus pneumothorax. EXAM: PORTABLE CHEST 1 VIEW COMPARISON:  12/15/2015 FINDINGS: Two left-sided chest tubes. Small left pleural effusion. Small-moderate right pleural  effusion. Bilateral interstitial thickening. No pneumothorax. Right jugular central venous catheter with the tip projecting over the SVC. Dual-lumen endotracheal tube with the proximal port 2 cm with the carina and the second tube in the left main stem bronchus. Bilateral diffuse interstitial thickening likely reflecting mild edema. Stable cardiomediastinal silhouette. No acute osseous abnormality. IMPRESSION: 1. Two left-sided chest tubes. Small left pleural effusion. Small-moderate right pleural effusion. Bilateral interstitial thickening. No pneumothorax. 2. Right jugular central venous catheter with the tip projecting over the SVC. 3. Dual-lumen endotracheal tube with the proximal port 2 cm with the carina and the second tube in the left main stem bronchus. Electronically Signed   By: Kathreen Devoid   On: 12/16/2015 17:32   Dg Chest Port 1 View  Result Date: 12/15/2015 CLINICAL DATA:  Cough and shortness of breath EXAM: PORTABLE CHEST 1 VIEW COMPARISON:  Chest radiograph December 13, 2015; chest CT December 14, 2015 FINDINGS: There is persistent pleural effusion on the left with left lower lobe consolidation. There is atelectatic change on the right with a rather minimal right pleural effusion. Heart is upper normal in size with pulmonary vascularity within normal limits. No adenopathy. No bone lesions. IMPRESSION: Sizable left  pleural effusion with left lower lobe consolidation. Mild right base atelectasis with small right pleural effusion. Stable cardiac silhouette. The degree of consolidation and effusion on the left have increased compared to 2 days prior and appears similar to 1 day prior. Electronically Signed   By: Lowella Grip III M.D.   On: 12/15/2015 12:11    SIGNIFICANT EVENTS  8/22 - Admit with CAP, CT concerning for loculation 8/24 - PCCM consulted for evaluation of pleural effusion.  Thora with 500 ml removed, tx to [facility]  8/26 - s/p bilateral chest tubes  STUDIES:  CT Head 8/23:  negative CT Chest 8/23: large left pleural effusion with concern for loculation, small right pleural effusion, L>R atelectasis and suspected PNA.  ANTIBIOTICS: Levaquin 8/22 >>  Aztreonam 8/24 >>   MICROBIOLOGY: MRSA PCR 8/24:  Negative Blood Ctx 8/22 >> Urine Ctx 8/22:  E coli L Pleural Effusion Ctx 8/24 >>  Left Pleural Fluid (510 ml) GS:  No organisms Cell Count:  3351 (70% neutro, 15% lymph, 15% monocyte) LDH:  470 Protein:  3.8 (8.5) Cytology>>  LINES/TUBES: PIV x1  ASSESSMENT / PLAN:    PULMONARY A: Severe CAP Acute Hypoxic Respiratory Failure - Secondary to Severe CAP Bilateral Pleural Effusions/Exudative Left Effusion - L>R. S/P Thora 8/24. H/O Asthma  P: Wean FiO2 for Sat >92% Hold in ICU given deteriorating CXR 1/2 dose PCA today to promote deep breaths. Ambulate and PT given atelectasis. Continuous Pulse Oximetry Tussionex & Tessalon perles for cough suppression Incentive Spirometry  INFECTIOUS A: Severe CAP Parapneumonic Effusion E coli UTI  P: Levaquin Day #5 & Aztreonam Day #3 Awaiting finalization of cultures CT drainage.  CARDIOVASCULAR A: No acute issues.  P: Continuous telemetry monitoring  Vitals per unit protocol  RENAL A: No acute issue.  P: Trending UOP Monitoring electrolytes & renal function daily Replacing electrolytes as indicated KVO IVF  HEMATOLOGY/ONCOLOGY A: Anemia - No signs of active bleeding.  Leukocytosis - Likely secondary to sepsis/infection.  P: Trending cell counts daily w/ CBC SCDs Lovenox Hecker daily Transfusing PRBC per CVTS  GASTROENTEROLOGY A: No acute issue.  P: Heart healthy diet  NEUROLOGICAL A: Pleurisy/Chest Pain - Secondary to effusion.  P: Increasing Morphine IV prn  ENDOCRINE A: Hyperglycemia - No h/o DM. Mild.  P: Monitor serum glucose on daily labs.  FAMILY DISCUSSION:  Patient updated bedside  TODAY'S SUMMARY:  65 year old female with an empyema s/p bilateral  chest tube.  CXR today almost completely whited out on the left and significant atelectasis on the right.  Will hold in the ICU given high risk for respiratory failure.  Ambulate and 1/2 dose the fentanyl as above.  Continue abx and CT care per CVTS.  The patient is critically ill with multiple organ systems failure and requires high complexity decision making for assessment and support, frequent evaluation and titration of therapies, application of advanced monitoring technologies and extensive interpretation of multiple databases.   Critical Care Time devoted to patient care services described in this note is  35  Minutes. This time reflects time of care of this signee Dr Jennet Maduro. This critical care time does not reflect procedure time, or teaching time or supervisory time of PA/NP/Med student/Med Resident etc but could involve care discussion time.  Rush Farmer, M.D. [facility] Pulmonary/Critical Care Medicine. Pager: [phone]. After hours pager: [phone].

## 2015-12-18 ENCOUNTER — Inpatient Hospital Stay (HOSPITAL_COMMUNITY): Payer: BLUE CROSS/BLUE SHIELD

## 2015-12-18 DIAGNOSIS — A419 Sepsis, unspecified organism: Secondary | ICD-10-CM

## 2015-12-18 LAB — CBC
HCT: 29.2 % — ABNORMAL LOW (ref 36.0–46.0)
Hemoglobin: 9.2 g/dL — ABNORMAL LOW (ref 12.0–15.0)
MCH: 26.5 pg (ref 26.0–34.0)
MCHC: 31.5 g/dL (ref 30.0–36.0)
MCV: 84.1 fL (ref 78.0–100.0)
PLATELETS: 447 10*3/uL — AB (ref 150–400)
RBC: 3.47 MIL/uL — ABNORMAL LOW (ref 3.87–5.11)
RDW: 15.6 % — AB (ref 11.5–15.5)
WBC: 15.3 10*3/uL — AB (ref 4.0–10.5)

## 2015-12-18 LAB — CULTURE, BLOOD (ROUTINE X 2)
CULTURE: NO GROWTH
CULTURE: NO GROWTH

## 2015-12-18 LAB — COMPREHENSIVE METABOLIC PANEL
ALT: 29 U/L (ref 14–54)
AST: 25 U/L (ref 15–41)
Albumin: 1.7 g/dL — ABNORMAL LOW (ref 3.5–5.0)
Alkaline Phosphatase: 96 U/L (ref 38–126)
Anion gap: 4 — ABNORMAL LOW (ref 5–15)
BUN: <5 mg/dL — ABNORMAL LOW (ref 6–20)
CHLORIDE: 105 mmol/L (ref 101–111)
CO2: 27 mmol/L (ref 22–32)
Calcium: 7.8 mg/dL — ABNORMAL LOW (ref 8.9–10.3)
Creatinine, Ser: 0.68 mg/dL (ref 0.44–1.00)
Glucose, Bld: 116 mg/dL — ABNORMAL HIGH (ref 65–99)
POTASSIUM: 3.5 mmol/L (ref 3.5–5.1)
Sodium: 136 mmol/L (ref 135–145)
Total Bilirubin: 0.6 mg/dL (ref 0.3–1.2)
Total Protein: 5 g/dL — ABNORMAL LOW (ref 6.5–8.1)

## 2015-12-18 LAB — MAGNESIUM: MAGNESIUM: 1.9 mg/dL (ref 1.7–2.4)

## 2015-12-18 LAB — PHOSPHORUS: PHOSPHORUS: 2.6 mg/dL (ref 2.5–4.6)

## 2015-12-18 LAB — PROCALCITONIN: Procalcitonin: 3.3 ng/mL

## 2015-12-18 MED ORDER — POTASSIUM CHLORIDE 10 MEQ/50ML IV SOLN
10.0000 meq | INTRAVENOUS | Status: AC
  Administered 2015-12-18: 10 meq via INTRAVENOUS
  Filled 2015-12-18 (×2): qty 50

## 2015-12-18 MED ORDER — POTASSIUM CHLORIDE CRYS ER 20 MEQ PO TBCR
40.0000 meq | EXTENDED_RELEASE_TABLET | Freq: Three times a day (TID) | ORAL | Status: AC
  Administered 2015-12-18 (×2): 40 meq via ORAL
  Filled 2015-12-18: qty 2

## 2015-12-18 MED ORDER — FUROSEMIDE 10 MG/ML IJ SOLN
40.0000 mg | Freq: Three times a day (TID) | INTRAMUSCULAR | Status: AC
  Administered 2015-12-18 (×2): 40 mg via INTRAVENOUS
  Filled 2015-12-18 (×2): qty 4

## 2015-12-18 NOTE — Progress Notes (Signed)
Name: Diana Murray MRN: [id number] DOB: 04-19-1951    ADMISSION DATE:  12/13/2015 CONSULTATION DATE:  12/15/15  REFERRING MD :  Dr. Wynelle Cleveland   CHIEF COMPLAINT:  Bilateral Pleural Effusions, L>R   HISTORY OF PRESENT ILLNESS:  65 y/o F, never smoker, with PMH of asthma and seasonal allergies who presnented to [facility] on 8/22 with reports of nausea / vomiting, left rib pain and chills.  On admit she reported [place] was at the beach and fell approximately 4 days prior to presentation.  She hit her head on the door frame without LOC and was not evaluated at that time. Two days prior to admit, she developed chills, malaise and left sided rib pain.  Initially, she thought it was related to her fall.  While at the beach, she noted that the air vents had mold.  She was seen at her PCP's office with CXR concerning for PNA and was treated with levaquin and tramadol.  She tolerated the first dose of levaquin but on 7/22 she was unable to tolerate oral intake.  Vomited after dose of levaquin prompting evaluation.  Initial ER work up notable for CXR concerning for LLL PNA vs mass & bilateral pleural effusions with bibasilar atelectasis, WBC 18.3 and UA with pyuria (culture pending).  CT of the head was evaluated and negative for acute process.  To further assess CXR findings, a CT of the chest was completed which demonstrated a large left pleural effusion with concern for loculation, small right pleural effusion, L>R atelectasis and suspected PNA. The patient was admitted per [facility] for community acquired pneumonia and treated with IV levaquin.  Due to CT findings, PCCM consulted for evaluation. Currently, she reports ongoing left sided chest pain, nausea and vomiting.     SUBJECTIVE: No acute events overnight. Remains lethargic, anticipate due to narcotics.  VITAL SIGNS: Temp:  [97.4 F (36.3 C)-99.6 F (37.6 C)] 98.8 F (37.1 C) (08/27 0728) Resp:  [15-34] 23 (08/27 0700) BP: (100-126)/(41-80) 109/64 (08/27  0700) SpO2:  [27 %-100 %] 100 % (08/27 0700) Arterial Line BP: (106-118)/(56) 118/56 (08/26 1000) FiO2 (%):  [0 %] 0 % (08/26 1518)  PHYSICAL EXAMINATION: General:  Awake. Alert. No acute distress. Sitting watching TV.   Integument:  Warm & dry. No rash on exposed skin.  HEENT:  Dry mucus membranes. No oral ulcers. No scleral injection or icterus. Cardiovascular:  Regular rate. No edema. No appreciable JVD.  Pulmonary:  No accessory muscle use on nasal cannula oxygen. Decreased breath sounds bilateral lung bases particularly on the left. Abdomen: Soft. Normal bowel sounds. Nondistended. Grossly nontender. Musculoskeletal:  Normal bulk and tone. No joint deformity or effusion appreciated. Neurological: Cranial nerves II-12 grossly intact. No meningismus. Moving all 4 extremities equally.   Recent Labs Lab 12/16/15 0754 12/17/15 0352 12/18/15 0405  NA 137 136 136  K 3.5 3.3* 3.5  CL 102 102 105  CO2 26 25 27   BUN <5* <5* <5*  CREATININE 0.64 0.71 0.68  GLUCOSE 135* 144* 116*    Recent Labs Lab 12/16/15 0754 12/17/15 0352 12/18/15 0405  HGB 9.1* 9.3* 9.2*  HCT 28.7* 29.5* 29.2*  WBC 12.0* 16.9* 15.3*  PLT 362 382 447*   Dg Chest Port 1 View  Result Date: 12/18/2015 CLINICAL DATA:  LEFT SIDED VATS WITH CT INSERTION 8/25 EXAM: PORTABLE CHEST 1 VIEW COMPARISON:  12/17/2015 FINDINGS: LEFT and RIGHT chest tubes unchanged. Interval decrease in pleural effusion on the LEFT. Decrease in vascular congestion. Bibasilar atelectasis  noted. No pneumothorax. IMPRESSION: 1. Chest tubes in place without pneumothorax. 2. Decrease in LEFT pleural effusions. 3. Improved vascular congestion. Electronically Signed   By: Suzy Bouchard M.D.   On: 12/18/2015 07:18   Dg Chest Port 1 View  Result Date: 12/17/2015 CLINICAL DATA:  Post thoracoscopy EXAM: PORTABLE CHEST 1 VIEW COMPARISON:  December 16, 2015 FINDINGS: The heart size and mediastinal contours are stable. The heart size is enlarged. Right  central venous line and bilateral chest tubes are unchanged. There is interval developed patchy consolidation throughout the left lung with relative sparing of the left apex. Patchy consolidation of the medial perihilar right lung is also identified. There is no pleural line to suggest pneumothorax. The visualized skeletal structures are stable. IMPRESSION: Interval developed patchy consolidation throughout the left lung with relative sparing of the left apex probably due to a combination of increasing pleural effusion and underlying pulmonary consolidation question pneumonia. Increased patchy consolidation of the right perihilar region pneumonia is not excluded. Electronically Signed   By: Abelardo Diesel M.D.   On: 12/17/2015 07:50   Dg Chest Port 1 View  Result Date: 12/16/2015 CLINICAL DATA:  Followup exam.  Chest tube in place. EXAM: PORTABLE CHEST 1 VIEW COMPARISON:  12/16/2015 at 1711 hours FINDINGS: Endotracheal tube has been removed since the prior exam. The 2 left-sided chest tubes are stable. New right inferior hemi thorax chest tube. Tip projects just above the right hemidiaphragm. Side hole of the tube projects just outside the thoracic cage. There has been a significant decrease in right pleural fluid following the right chest tube placement. No convincing pneumothorax. Left lung base opacity is similar to the prior study. Stable right internal jugular central venous line. IMPRESSION: 1. New right inferior hemi thorax chest tube. There has been a significant decrease in right pleural fluid with clearing of much of the right lung base opacity. No convincing pneumothorax. 2. No change in the 2 left-sided chest tubes or in the appearance of the left lung. 3. Status post removal of the endotracheal tube. Electronically Signed   By: Lajean Manes M.D.   On: 12/16/2015 18:31   Dg Chest Portable 1 View  Result Date: 12/16/2015 CLINICAL DATA:  Pole effusion versus pneumothorax. EXAM: PORTABLE CHEST 1  VIEW COMPARISON:  12/15/2015 FINDINGS: Two left-sided chest tubes. Small left pleural effusion. Small-moderate right pleural effusion. Bilateral interstitial thickening. No pneumothorax. Right jugular central venous catheter with the tip projecting over the SVC. Dual-lumen endotracheal tube with the proximal port 2 cm with the carina and the second tube in the left main stem bronchus. Bilateral diffuse interstitial thickening likely reflecting mild edema. Stable cardiomediastinal silhouette. No acute osseous abnormality. IMPRESSION: 1. Two left-sided chest tubes. Small left pleural effusion. Small-moderate right pleural effusion. Bilateral interstitial thickening. No pneumothorax. 2. Right jugular central venous catheter with the tip projecting over the SVC. 3. Dual-lumen endotracheal tube with the proximal port 2 cm with the carina and the second tube in the left main stem bronchus. Electronically Signed   By: Kathreen Devoid   On: 12/16/2015 17:32    SIGNIFICANT EVENTS  8/22 - Admit with CAP, CT concerning for loculation 8/24 - PCCM consulted for evaluation of pleural effusion.  Thora with 500 ml removed, tx to [facility]  8/26 - s/p bilateral chest tubes  STUDIES:  CT Head 8/23: negative CT Chest 8/23: large left pleural effusion with concern for loculation, small right pleural effusion, L>R atelectasis and suspected PNA.  ANTIBIOTICS: Levaquin 8/22 >>  Aztreonam 8/24 >>   MICROBIOLOGY: MRSA PCR 8/24:  Negative Blood Ctx 8/22 >> Urine Ctx 8/22:  E coli L Pleural Effusion Ctx 8/24 >>  Left Pleural Fluid (510 ml) GS:  No organisms Cell Count:  3351 (70% neutro, 15% lymph, 15% monocyte) LDH:  470 Protein:  3.8 (8.5) Cytology>> pending on 8/27  LINES/TUBES: PIV x1  I reviewed CXR myself, much improved aeration today.  ASSESSMENT / PLAN:    PULMONARY A: Severe CAP Acute Hypoxic Respiratory Failure - Secondary to Severe CAP Bilateral Pleural Effusions/Exudative Left Effusion - L>R. S/P  Thora 8/24. H/O Asthma  P: Wean FiO2 for Sat >92% Transfer to SDU 3S. Will defer PCA to CVTS at this point. Ambulate and PT given atelectasis. Continuous Pulse Oximetry Tussionex & Tessalon perles for cough suppression Incentive Spirometry  INFECTIOUS A: Severe CAP Parapneumonic Effusion E coli UTI  P: Levaquin Day #6 & Aztreonam Day #4 Awaiting finalization of cultures CT drainage.  CARDIOVASCULAR A: No acute issues.  P: Continuous telemetry monitoring  Vitals per unit protocol  RENAL A: No acute issue.  P: Trending UOP Monitoring electrolytes & renal function daily Replacing electrolytes as indicated KVO IVF Lasix 40 mg IV q8 x2 doses. K-dur preemptively.   HEMATOLOGY/ONCOLOGY A: Anemia - No signs of active bleeding.  Leukocytosis - Likely secondary to sepsis/infection.  P: Trending cell counts daily w/ CBC SCDs Lovenox Donaldson daily Transfusing PRBC per CVTS  GASTROENTEROLOGY A: No acute issue.  P: Heart healthy diet  NEUROLOGICAL A: Pleurisy/Chest Pain - Secondary to effusion.  P: Increasing Morphine IV prn  ENDOCRINE A: Hyperglycemia - No h/o DM. Mild.  P: Monitor serum glucose on daily labs.  FAMILY DISCUSSION:  Patient updated bedside  Discussed with [facility]-MD, transfer to 3S and back to [facility] service with PCCM off 8/28.  Rush Farmer, M.D. [facility] Pulmonary/Critical Care Medicine. Pager: [phone]. After hours pager: [phone].

## 2015-12-18 NOTE — Progress Notes (Signed)
Patient ID: Diana Murray, female   DOB: 05-22-1950, 65 y.o.   MRN: [id number]  SICU Evening Rounds:  Hemodynamically stable.  Right chest tube is out  Minimal drainage from left tubes. Should be able to remove at least one in the am.

## 2015-12-18 NOTE — Progress Notes (Signed)
[facility] ADULT ICU REPLACEMENT PROTOCOL FOR AM LAB REPLACEMENT ONLY  The patient does apply for the [facility] Adult ICU Electrolyte Replacment Protocol based on the criteria listed below:   1. Is GFR >/= 40 ml/min? Yes.    Patient's GFR today is >60 2. Is urine output >/= 0.5 ml/kg/hr for the last 6 hours? Yes.   Patient's UOP is 0.6 ml/kg/hr 3. Is BUN < 60 mg/dL? Yes.    Patient's BUN today is <5 4. Abnormal electrolyte(s): K3.5 5. Ordered repletion with: per protocol 6. If a panic level lab has been reported, has the CCM MD in charge been notified? Yes.  .   Physician:  Rockwell Alexandria, MD  Vear Clock 12/18/2015 5:34 AM

## 2015-12-18 NOTE — Evaluation (Signed)
Physical Therapy Evaluation Patient Details Name: Diana Murray MRN: [id number] DOB: 22-Jun-1950 Today's Date: 12/18/2015   History of Present Illness  Patient is a 65 yo female admitted 12/13/15 with N/V, Lt rib pain.  Patient with CAP, Bil. pleural effusions L>R.  Patient s/p Lt VATS with mini thoracotomy 8/25.  Chest tubes Rt and Lt.    PMH:  Asthma    Clinical Impression  Patient presents with problems listed below.  Will benefit from acute PT to maximize functional mobility prior to discharge.  Supportive daughter - will be going to daughter's home at d/c for 24 hour assist.      Follow Up Recommendations Home health PT;Supervision/Assistance - 24 hour    Equipment Recommendations  Rolling walker with 5" wheels;3in1 (PT)    Recommendations for Other Services       Precautions / Restrictions Precautions Precautions: Fall Precaution Comments: chest tubes Restrictions Weight Bearing Restrictions: No      Mobility  Bed Mobility Overal bed mobility: Needs Assistance Bed Mobility: Supine to Sit     Supine to sit: Mod assist     General bed mobility comments: Assist to raise trunk to upright sitting position.  Once sitting EOB, patient with good sitting balance.  Transfers Overall transfer level: Needs assistance Equipment used: Rolling walker (2 wheeled) Transfers: Sit to/from Stand Sit to Stand: Min assist;+2 safety/equipment         General transfer comment: Verbal cues for hand placement.  Assist to rise to standing and for balance.    Ambulation/Gait Ambulation/Gait assistance: Min assist;+2 safety/equipment Ambulation Distance (Feet): 250 Feet Assistive device: Rolling walker (2 wheeled) Gait Pattern/deviations: Step-through pattern;Decreased step length - right;Decreased step length - left;Decreased stride length;Trunk flexed Gait velocity: decreased Gait velocity interpretation: Below normal speed for age/gender General Gait Details: Verbal cues for  safe use of RW, and to stand upright.  Patient with very slow, guarded gait, with decreased trunk movement.  Encouraged deep breathing during gait.  Patient ambulated on room air with O2 sats remaining in 90's.  Stairs            Wheelchair Mobility    Modified Rankin (Stroke Patients Only)       Balance Overall balance assessment: Needs assistance         Standing balance support: Bilateral upper extremity supported Standing balance-Leahy Scale: Poor                               Pertinent Vitals/Pain Pain Assessment: 0-10 Pain Score: 5  Pain Location: Lt side Pain Descriptors / Indicators: Sore;Tender Pain Intervention(s): Monitored during session;Repositioned    Home Living Family/patient expects to be discharged to:: Private residence Living Arrangements: Alone Available Help at Discharge: Family;Available 24 hours/day (Patient going to daughter's home at d/c) Type of Home: House Home Access: Stairs to enter Entrance Stairs-Rails: None Entrance Stairs-Number of Steps: 1 Home Layout: Two level;Bed/bath upstairs Home Equipment: Shower seat - built in;Hand held shower head Additional Comments: Daughter will be helping patient 24/7 at d/c.  Daughter is Community education officer in [place] setting    Prior Function Level of Independence: Independent         Comments: Works in [employer]: Right    Extremity/Trunk Assessment   Upper Extremity Assessment: Generalized weakness           Lower Extremity Assessment: Generalized weakness  Cervical / Trunk Assessment: Other exceptions  Communication   Communication: No difficulties  Cognition Arousal/Alertness: Awake/alert Behavior During Therapy: WFL for tasks assessed/performed;Anxious Overall Cognitive Status: Within Functional Limits for tasks assessed                      General Comments      Exercises Other Exercises Other  Exercises: Incentive spirometer:  Had patient practice use of IS.  Patient taking very quick shallow breaths.  Had patient practice slow inspiration with increased time.        Assessment/Plan    PT Assessment Patient needs continued PT services  PT Diagnosis Difficulty walking;Abnormality of gait;Generalized weakness;Acute pain   PT Problem List Decreased strength;Decreased activity tolerance;Decreased balance;Decreased mobility;Decreased knowledge of use of DME;Cardiopulmonary status limiting activity;Pain  PT Treatment Interventions DME instruction;Gait training;Stair training;Functional mobility training;Therapeutic activities;Therapeutic exercise;Patient/family education   PT Goals (Current goals can be found in the Care Plan section) Acute Rehab PT Goals Patient Stated Goal: To get stronger PT Goal Formulation: With patient/family Time For Goal Achievement: 12/25/15 Potential to Achieve Goals: Good    Frequency Min 3X/week   Barriers to discharge Decreased caregiver support Patient lives alone.  Will be going to daughter's home at d/c.  Daughter available 24*    Co-evaluation               End of Session   Activity Tolerance: Patient tolerated treatment well;Patient limited by fatigue Patient left: in chair;with call bell/phone within reach;with family/visitor present;with nursing/sitter in room Nurse Communication: Mobility status         Time: 1127-1205 PT Time Calculation (min) (ACUTE ONLY): 38 min   Charges:   PT Evaluation $PT Eval High Complexity: 1 Procedure PT Treatments $Gait Training: 23-37 mins   PT G CodesDespina Pole 2016/01/16, 6:35 PM Carita Pian. Sanjuana Kava, [place] Pager [phone]

## 2015-12-18 NOTE — Progress Notes (Signed)
Pharmacy Antibiotic Note  Diana Murray is a 65 y.o. female who continues on day #4 aztreonam and day #6 levaquin for severe CAP, left pleural effusion w/ empyema s/p VATS 8/25, and Ecoli UTI. Renal function has remained stable. Cultures negative.  Plan: 1) Continue aztreonam 2g IV q8 2) Continue levaquin 750mg  IV q24 3) Continue to follow renal function, cultures LOT  Height: 5\' 8"  (172.7 cm) Weight: 199 lb 1.2 oz (90.3 kg) IBW/kg (Calculated) : 63.9  Temp (24hrs), Avg:98.2 F (36.8 C), Min:97.4 F (36.3 C), Max:99.6 F (37.6 C)   Recent Labs Lab 12/13/15 1941 12/13/15 2221 12/14/15 0230 12/14/15 0810 12/15/15 0752 12/16/15 0754 12/17/15 0352 12/18/15 0405  WBC  --   --  14.2*  --  12.5* 12.0* 16.9* 15.3*  CREATININE  --   --  0.78  --  0.71 0.64 0.71 0.68  LATICACIDVEN 1.08 3.56*  --  0.5  --   --   --   --     Estimated Creatinine Clearance: 83.6 mL/min (by C-G formula based on SCr of 0.8 mg/dL).    Allergies  Allergen Reactions  . Penicillins Anaphylaxis    Has patient had a PCN reaction causing immediate rash, facial/tongue/throat swelling, SOB or lightheadedness with hypotension: Yes Has patient had a PCN reaction causing severe rash involving mucus membranes or skin necrosis: No Has patient had a PCN reaction that required hospitalization No Has patient had a PCN reaction occurring within the last 10 years: No If all of the above answers are "NO", then may proceed with Cephalosporin use.    Antimicrobials this admission: 8/21 Levaquin>> 8/24 Aztreonam >>  Dose adjustments this admission: n/a  Microbiology results: 8/22 BCx: negative, final 8/22 UCx: 10 K Ecoli 8/24 pleural fluid: ngtd 8/25 pleural fluid: ngtd 8/25 pleural peel left: ngtd  Thank you for allowing pharmacy to be a part of this patient's care.  Deboraha Sprang 12/18/2015 12:48 PM

## 2015-12-18 NOTE — Plan of Care (Signed)
Problem: Activity: Goal: Risk for activity intolerance will decrease Outcome: Progressing Slowly progressing activity  Problem: Cardiac: Goal: Hemodynamic stability will improve Outcome: Progressing BP and HR WNL

## 2015-12-18 NOTE — Progress Notes (Signed)
2 Days Post-Op Procedure(s) (LRB): Left VIDEO ASSISTED THORACOSCOPY (Left) CHEST TUBE INSERTION right (Right) Subjective:  No complaints. More alert today. 500 on IS.  Objective: Vital signs in last 24 hours: Temp:  [97.4 F (36.3 C)-99.6 F (37.6 C)] 98.8 F (37.1 C) (08/27 0728) Cardiac Rhythm: Sinus tachycardia (08/27 0705) Resp:  [15-30] 19 (08/27 0800) BP: (100-126)/(41-80) 109/64 (08/27 0700) SpO2:  [27 %-100 %] 93 % (08/27 0800) Arterial Line BP: (118)/(56) 118/56 (08/26 1000) FiO2 (%):  [0 %] 0 % (08/26 1518)  Hemodynamic parameters for last 24 hours:    Intake/Output from previous day: 08/26 0701 - 08/27 0700 In: 960 [P.O.:250; I.V.:310; IV Piggyback:400] Out: 2310 [Urine:1890; Chest Tube:420] Intake/Output this shift: No intake/output data recorded.  General appearance: alert and cooperative Heart: regular rate and rhythm, S1, S2 normal, no murmur, click, rub or gallop Lungs: rales LLL Wound: dressing dry serous drainage from chest tubes, no air leaks  Lab Results:  Recent Labs  12/17/15 0352 12/18/15 0405  WBC 16.9* 15.3*  HGB 9.3* 9.2*  HCT 29.5* 29.2*  PLT 382 447*   BMET:  Recent Labs  12/17/15 0352 12/18/15 0405  NA 136 136  K 3.3* 3.5  CL 102 105  CO2 25 27  GLUCOSE 144* 116*  BUN <5* <5*  CREATININE 0.71 0.68  CALCIUM 7.8* 7.8*    PT/INR:  Recent Labs  12/15/15 2315  LABPROT 16.0*  INR 1.27   ABG    Component Value Date/Time   PHART 7.347 (L) 12/17/2015 0353   HCO3 25.8 (H) 12/17/2015 0353   TCO2 27 12/17/2015 0353   O2SAT 92.0 12/17/2015 0353   CBG (last 3)  No results for input(s): GLUCAP in the last 72 hours.   CLINICAL DATA:  LEFT SIDED VATS WITH CT INSERTION 8/25  EXAM: PORTABLE CHEST 1 VIEW  COMPARISON:  12/17/2015  FINDINGS: LEFT and RIGHT chest tubes unchanged. Interval decrease in pleural effusion on the LEFT. Decrease in vascular congestion. Bibasilar atelectasis noted. No  pneumothorax.  IMPRESSION: 1. Chest tubes in place without pneumothorax. 2. Decrease in LEFT pleural effusions. 3. Improved vascular congestion.   Electronically Signed   By: Suzy Bouchard M.D.   On: 12/18/2015 07:18  Assessment/Plan: S/P Procedure(s) (LRB): Left VIDEO ASSISTED THORACOSCOPY (Left) CHEST TUBE INSERTION right (Right)  Improved aeration of the lungs. Will remove right chest tube since it is not draining much and looks like the last hole is in the subcutaneous tissue. Continue left tubes to suction but probably be able to remove one tomorrow. Cultures remain negative. Other plans per CCM.   LOS: 5 days    Gaye Pollack 12/18/2015

## 2015-12-19 ENCOUNTER — Inpatient Hospital Stay (HOSPITAL_COMMUNITY): Payer: BLUE CROSS/BLUE SHIELD

## 2015-12-19 ENCOUNTER — Encounter (HOSPITAL_COMMUNITY): Payer: Self-pay | Admitting: Cardiothoracic Surgery

## 2015-12-19 LAB — CBC
HEMATOCRIT: 28 % — AB (ref 36.0–46.0)
Hemoglobin: 8.7 g/dL — ABNORMAL LOW (ref 12.0–15.0)
MCH: 26.3 pg (ref 26.0–34.0)
MCHC: 31.1 g/dL (ref 30.0–36.0)
MCV: 84.6 fL (ref 78.0–100.0)
PLATELETS: 479 10*3/uL — AB (ref 150–400)
RBC: 3.31 MIL/uL — ABNORMAL LOW (ref 3.87–5.11)
RDW: 15.7 % — AB (ref 11.5–15.5)
WBC: 11.9 10*3/uL — ABNORMAL HIGH (ref 4.0–10.5)

## 2015-12-19 LAB — TYPE AND SCREEN
ABO/RH(D): O POS
Antibody Screen: NEGATIVE
Unit division: 0
Unit division: 0
Unit division: 0

## 2015-12-19 LAB — BODY FLUID CULTURE: CULTURE: NO GROWTH

## 2015-12-19 LAB — BASIC METABOLIC PANEL
Anion gap: 7 (ref 5–15)
BUN: 8 mg/dL (ref 6–20)
CO2: 31 mmol/L (ref 22–32)
CREATININE: 0.77 mg/dL (ref 0.44–1.00)
Calcium: 7.9 mg/dL — ABNORMAL LOW (ref 8.9–10.3)
Chloride: 100 mmol/L — ABNORMAL LOW (ref 101–111)
GFR calc Af Amer: >60 mL/min (ref >60)
GLUCOSE: 104 mg/dL — AB (ref 65–99)
POTASSIUM: 3.5 mmol/L (ref 3.5–5.1)
Sodium: 138 mmol/L (ref 135–145)

## 2015-12-19 LAB — PHOSPHORUS: PHOSPHORUS: 3.2 mg/dL (ref 2.5–4.6)

## 2015-12-19 LAB — MAGNESIUM: Magnesium: 2 mg/dL (ref 1.7–2.4)

## 2015-12-19 MED ORDER — LEVOFLOXACIN 750 MG PO TABS
750.0000 mg | ORAL_TABLET | Freq: Every day | ORAL | Status: AC
  Administered 2015-12-19 – 2015-12-21 (×3): 750 mg via ORAL
  Filled 2015-12-19 (×3): qty 1

## 2015-12-19 NOTE — Progress Notes (Signed)
DC'd ON Q and left posterior pleural tube per MD orders.   Rowe Pavy, RN

## 2015-12-19 NOTE — Progress Notes (Signed)
Physical Therapy Treatment Patient Details Name: Lindell NoeBrenda P Saintil MRN: [id number] DOB: 09/16/1950 Today's Date: 12/19/2015    History of Present Illness Patient is a 65 yo female admitted 12/13/15 with N/V, Lt rib pain.  Patient with CAP, Bil. pleural effusions L>R.  Patient s/p Lt VATS with mini thoracotomy 8/25.  Chest tubes Rt and Lt.    PMH:  Asthma    PT Comments    Pt moving well with increased activity tolerance and decreased pain. Pt encouraged to continue ambulation with nursing staff as well as HEP. Pt educated for DME and transfers. Will continue to follow  HR 83 sats 94-97% on 4L throughout BP 109/65  Follow Up Recommendations  Home health PT;Supervision for mobility/OOB     Equipment Recommendations  Rolling walker with 5" wheels;3in1 (PT)    Recommendations for Other Services       Precautions / Restrictions Precautions Precautions: Fall Precaution Comments: left chest tube    Mobility  Bed Mobility Overal bed mobility: Needs Assistance Bed Mobility: Sit to Sidelying         Sit to sidelying: Min assist General bed mobility comments: assist to elevate legs onto surface with cues for sequence, HOB 15degrees, pt's home bed does elevate  Transfers Overall transfer level: Needs assistance   Transfers: Sit to/from Stand Sit to Stand: Supervision         General transfer comment: cues for hand placement  Ambulation/Gait Ambulation/Gait assistance: Min guard Ambulation Distance (Feet): 300 Feet Assistive device: Rolling walker (2 wheeled) Gait Pattern/deviations: Step-through pattern;Decreased stride length   Gait velocity interpretation: Below normal speed for age/gender General Gait Details: cues for posture and position in RW, pt with good breathing technique and decreased gait speed   Stairs            Wheelchair Mobility    Modified Rankin (Stroke Patients Only)       Balance             Standing balance-Leahy Scale:  Fair                      Cognition Arousal/Alertness: Awake/alert Behavior During Therapy: WFL for tasks assessed/performed Overall Cognitive Status: Within Functional Limits for tasks assessed                      Exercises General Exercises - Lower Extremity Long Arc Quad: AROM;Both;10 reps;Seated Hip Flexion/Marching: AROM;Both;15 reps;Seated    General Comments        Pertinent Vitals/Pain Pain Score: 5  Pain Location: left side at CT site Pain Descriptors / Indicators: Burning;Sore Pain Intervention(s): Limited activity within patient's tolerance;Monitored during session;Repositioned    Home Living                      Prior Function            PT Goals (current goals can now be found in the care plan section) Progress towards PT goals: Progressing toward goals    Frequency       PT Plan Current plan remains appropriate    Co-evaluation             End of Session Equipment Utilized During Treatment: Oxygen Activity Tolerance: Patient tolerated treatment well Patient left: in bed;with call bell/phone within reach;with nursing/sitter in room     Time: [id number] PT Time Calculation (min) (ACUTE ONLY): 25 min  Charges:  $Gait Training: 8-22 mins $Therapeutic Exercise: 8-22  mins                    G Codes:      Melford Aase 2016/01/04, 9:01 AM Elwyn Reach, [place]

## 2015-12-19 NOTE — Progress Notes (Signed)
[place] TEAM 1 - Stepdown/ICU TEAM  Diana Murray  [id number] DOB: 20-Jul-1950 DOA: 12/13/2015 PCP: Marda Stalker, PA-C    Brief Narrative:  65 y/o F never smoker w/ Hx of asthma who presented to [facility] on 8/22 with nausea / vomiting, left rib pain and chills.  She reported she fell approximately 4 days prior.  She hit her head on the door frame without LOC and was not evaluated at that time. Two days prior to admit, she developed chills, malaise and left sided rib pain.  She was seen at her PCP's office with CXR concerning for PNA and treated with levaquin and tramadol.  She tolerated the first dose of levaquin but on 7/22 she was unable to tolerate oral intake.  Vomited after dose of levaquin prompted evaluation.    Initial ER CXR concerning for LLL PNA vs mass & bilateral pleural effusions with bibasilar atelectasis, WBC 18.3 and UA with pyuria.  CT of the head was negative for acute process.  CT of the chest demonstrated a large left pleural effusion with concern for loculation, small right pleural effusion, L>R atelectasis and suspected PNA. The patient was admitted per [facility] for community acquired pneumonia and treated with IV levaquin.  Due to CT findings, PCCM consulted for evaluation.   Significant Events: 8/22 - Admit with CAP, CT concerning for loculation 8/24 - PCCM consulted for evaluation of pleural effusion.  Thora with 500 ml removed, tx to [facility]  8/25 - s/p bilateral chest tubes - L VATS drainage of empyema - R chest tube placement   Subjective: The pt is resting comfortably.  She denies n/v, abdom pain, or sob.  She is having expected pain at her chest tube sites, but this is well controlled per her report.    Assessment & Plan:  Severe CAP w/ Acute Hypoxic Respiratory Failure Cont empiric abx tx - clinically stabilizing   Bilateral Parapneumonic Pleural Effusions L>R S/P Thora 8/24 w/ VATS 8/26 - post VATS care per TCTS  H/O Asthma No active wheeze at this time    E coli UTI Should adequately covered w/ current abx  Anemia No signs of active bleeding - due to severe illness of subacute nature   Hyperglycemia No h/o DM  DVT prophylaxis: lovenox  Code Status: FULL CODE Family Communication: spoke w/ daughter (a PT) at bedside  Disposition Plan: transfer to SDU   Consultants:  PCCM TCTS  Antimicrobials:  Levaquin 8/22 >  Aztreonam 8/24 >   Objective: Blood pressure 101/66, pulse 100, temperature 98.3 F (36.8 C), temperature source Oral, resp. rate 19, height 5\' 8"  (1.727 m), weight 90.3 kg (199 lb 1.2 oz), SpO2 96 %.  Intake/Output Summary (Last 24 hours) at 12/19/15 1002 Last data filed at 12/19/15 0800  Gross per 24 hour  Intake              700 ml  Output             3755 ml  Net            -3055 ml   Filed Weights   12/13/15 1445 12/14/15 0000  Weight: 83.5 kg (184 lb) 90.3 kg (199 lb 1.2 oz)    Examination: General: No acute respiratory distress Lungs: Clear to auscultation bilaterally without wheezes or crackles - poor air movement B bases  Cardiovascular: Regular rate and rhythm without murmur gallop or rub normal S1 and S2 Abdomen: Nontender, nondistended, soft, bowel sounds positive, no rebound, no ascites,  no appreciable mass Extremities: No significant cyanosis, clubbing, or edema bilateral lower extremities  CBC:  Recent Labs Lab 12/15/15 0752 12/16/15 0754 12/17/15 0352 12/18/15 0405 12/19/15 0408  WBC 12.5* 12.0* 16.9* 15.3* 11.9*  NEUTROABS  --  8.7* 13.6*  --   --   HGB 7.7* 9.1* 9.3* 9.2* 8.7*  HCT 24.2* 28.7* 29.5* 29.2* 28.0*  MCV 84.9 84.4 85.3 84.1 84.6  PLT 385 362 382 447* 123456*   Basic Metabolic Panel:  Recent Labs Lab 12/15/15 0752 12/16/15 0754 12/17/15 0352 12/18/15 0405 12/19/15 0408  NA 135 137 136 136 138  K 3.6 3.5 3.3* 3.5 3.5  CL 105 102 102 105 100*  CO2 26 26 25 27 31   GLUCOSE 114* 135* 144* 116* 104*  BUN 6 <5* <5* <5* 8  CREATININE 0.71 0.64 0.71 0.68 0.77   CALCIUM 8.1* 8.3* 7.8* 7.8* 7.9*  MG  --  1.9 1.7 1.9 2.0  PHOS  --  2.9 3.6 2.6 3.2   GFR: Estimated Creatinine Clearance: 83.6 mL/min (by C-G formula based on SCr of 0.8 mg/dL).  Liver Function Tests:  Recent Labs Lab 12/13/15 1510 12/16/15 0754 12/17/15 0352 12/18/15 0405  AST 18  --   --  25  ALT 18  --   --  29  ALKPHOS 74  --   --  96  BILITOT 0.5  --   --  0.6  PROT 8.5*  --   --  5.0*  ALBUMIN 3.8 2.2* 1.8* 1.7*    Recent Labs Lab 12/13/15 1510  LIPASE 17   Coagulation Profile:  Recent Labs Lab 12/15/15 2315  INR 1.27    Recent Results (from the past 240 hour(s))  Urine culture     Status: Abnormal   Collection Time: 12/13/15  3:03 PM  Result Value Ref Range Status   Specimen Description URINE, CLEAN CATCH  Final   Special Requests NONE  Final   Culture 10,000 COLONIES/mL ESCHERICHIA COLI (A)  Final   Report Status 12/16/2015 FINAL  Final   Organism ID, Bacteria ESCHERICHIA COLI (A)  Final      Susceptibility   Escherichia coli - MIC*    AMPICILLIN >=32 RESISTANT Resistant     CEFAZOLIN <=4 SENSITIVE Sensitive     CEFTRIAXONE <=1 SENSITIVE Sensitive     CIPROFLOXACIN >=4 RESISTANT Resistant     GENTAMICIN >=16 RESISTANT Resistant     IMIPENEM <=0.25 SENSITIVE Sensitive     NITROFURANTOIN <=16 SENSITIVE Sensitive     TRIMETH/SULFA <=20 SENSITIVE Sensitive     AMPICILLIN/SULBACTAM 16 INTERMEDIATE Intermediate     PIP/TAZO <=4 SENSITIVE Sensitive     Extended ESBL NEGATIVE Sensitive     * 10,000 COLONIES/mL ESCHERICHIA COLI  Blood Culture (routine x 2)     Status: None   Collection Time: 12/13/15  7:30 PM  Result Value Ref Range Status   Specimen Description BLOOD RIGHT ANTECUBITAL  Final   Special Requests BOTTLES DRAWN AEROBIC AND ANAEROBIC 5 CC EACH  Final   Culture   Final    NO GROWTH 5 DAYS Performed at [facility]    Report Status 12/18/2015 FINAL  Final  Blood Culture (routine x 2)     Status: None   Collection Time:  12/13/15  8:02 PM  Result Value Ref Range Status   Specimen Description BLOOD LEFT ANTECUBITAL  Final   Special Requests BOTTLES DRAWN AEROBIC AND ANAEROBIC 5CC  Final   Culture   Final  NO GROWTH 5 DAYS Performed at [facility]    Report Status 12/18/2015 FINAL  Final  Body fluid culture     Status: None (Preliminary result)   Collection Time: 12/15/15 11:24 AM  Result Value Ref Range Status   Specimen Description PLEURAL  Final   Special Requests NONE  Final   Gram Stain   Final    MODERATE WBC PRESENT, PREDOMINANTLY PMN NO ORGANISMS SEEN    Culture   Final    NO GROWTH 3 DAYS Performed at [facility]    Report Status PENDING  Incomplete  Acid Fast Smear (AFB)     Status: None   Collection Time: 12/15/15 11:35 AM  Result Value Ref Range Status   AFB Specimen Processing Concentration  Final   Acid Fast Smear Negative  Final    Comment: (NOTE) Performed At: [facility] [place], [place] [id number] Lindon Romp MD [id number]    Source (AFB) PLEURAL  Final  MRSA PCR Screening     Status: None   Collection Time: 12/15/15  4:48 PM  Result Value Ref Range Status   MRSA by PCR NEGATIVE NEGATIVE Final    Comment:        The GeneXpert MRSA Assay (FDA approved for NASAL specimens only), is one component of a comprehensive MRSA colonization surveillance program. It is not intended to diagnose MRSA infection nor to guide or monitor treatment for MRSA infections.   Culture, body fluid-bottle     Status: None (Preliminary result)   Collection Time: 12/16/15  4:06 PM  Result Value Ref Range Status   Specimen Description FLUID PLEURAL LEFT  Final   Special Requests BOTTLES DRAWN AEROBIC AND ANAEROBIC 10CC  Final   Culture NO GROWTH 2 DAYS  Final   Report Status PENDING  Incomplete  Gram stain     Status: None   Collection Time: 12/16/15  4:06 PM  Result Value Ref Range Status   Specimen Description FLUID PLEURAL  Final    Special Requests NONE  Final   Gram Stain   Final    CYTOSPIN SMEAR CORRECTED ON 08/25 AT 2118: PREVIOUSLY REPORTED AS FEW WBC PRESENT, PREDOMINANTLY PMN WBC PRESENT, PREDOMINANTLY PMN CORRECTED ON 08/25 AT 2118: PREVIOUSLY REPORTED AS NO ORGANISMS SEEN NO ORGANISMS SEEN    Report Status 12/16/2015 FINAL  Final  Acid Fast Smear (AFB)     Status: None   Collection Time: 12/16/15  4:10 PM  Result Value Ref Range Status   AFB Specimen Processing Comment  Final    Comment: Tissue Grinding and Digestion/Decontamination   Acid Fast Smear Negative  Final    Comment: (NOTE) Performed At: [facility] [place], [place] [id number] Lindon Romp MD [id number]    Source (AFB) TISSUE  Final    Comment: LEFT PLEURAL PEEL  Aerobic/Anaerobic Culture (surgical/deep wound)     Status: None (Preliminary result)   Collection Time: 12/16/15  4:10 PM  Result Value Ref Range Status   Specimen Description TISSUE  Final   Special Requests PLEURAL PEEL LEFT  Final   Gram Stain   Final    FEW WBC PRESENT, PREDOMINANTLY PMN NO ORGANISMS SEEN    Culture NO GROWTH 2 DAYS  Final   Report Status PENDING  Incomplete     Scheduled Meds: . acetaminophen  1,000 mg Oral Q6H   Or  . acetaminophen (TYLENOL) oral liquid 160 mg/5 mL  1,000 mg Oral Q6H  .  aztreonam  2 g Intravenous Q8H  . benzonatate  100 mg Oral TID  . bisacodyl  10 mg Oral Daily  . calcium-vitamin D  1 tablet Oral Daily  . cholecalciferol  1,000 Units Oral Daily  . enoxaparin (LOVENOX) injection  40 mg Subcutaneous Q24H  . fentaNYL   Intravenous Q4H  . levofloxacin (LEVAQUIN) IV  750 mg Intravenous Q24H  . oxybutynin  5 mg Oral Daily  . pantoprazole  40 mg Oral Q1200  . senna-docusate  1 tablet Oral QHS  . sodium chloride flush  10-40 mL Intracatheter Q12H   Continuous Infusions: . bupivacaine ON-Q pain pump    . lactated ringers 10 mL/hr at 12/17/15 1912     LOS: 6 days   Cherene Altes, MD [facility] Office  [phone] Pager - Text Page per Shea Evans as per below:  On-Call/Text Page:      [email or web address]      password [email or web address]  If 7PM-7AM, please contact night-coverage [email or web address] Password [email or web address] 12/19/2015, 10:02 AM

## 2015-12-19 NOTE — Progress Notes (Signed)
3 Days Post-Op Procedure(s) (LRB): Left VIDEO ASSISTED THORACOSCOPY (Left) CHEST TUBE INSERTION right (Right) Subjective: doing well- pain improved, CXR clearing Down to one chest tube Cont PCA Objective: Vital signs in last 24 hours: Temp:  [97.6 F (36.4 C)-98.7 F (37.1 C)] 98.5 F (36.9 C) (08/28 1137) Cardiac Rhythm: Sinus tachycardia (08/28 0728) Resp:  [18-30] 22 (08/28 1137) BP: (86-116)/(53-82) 101/66 (08/28 0900) SpO2:  [93 %-100 %] 95 % (08/28 1137)  Hemodynamic parameters for last 24 hours:  stable  Intake/Output from previous day: 08/27 0701 - 08/28 0700 In: 760 [I.V.:460; IV Piggyback:300] Out: 3680 [Urine:3655; Chest Tube:25] Intake/Output this shift: Total I/O In: -  Out: 75 [Urine:75]       Exam    General- alert and comfortable   Lungs- clear without rales, wheezes   Cor- regular rate and rhythm, no murmur , gallop   Abdomen- soft, non-tender   Extremities - warm, non-tender, minimal edema   Neuro- oriented, appropriate, no focal weakness   Lab Results:  Recent Labs  12/18/15 0405 12/19/15 0408  WBC 15.3* 11.9*  HGB 9.2* 8.7*  HCT 29.2* 28.0*  PLT 447* 479*   BMET:  Recent Labs  12/18/15 0405 12/19/15 0408  NA 136 138  K 3.5 3.5  CL 105 100*  CO2 27 31  GLUCOSE 116* 104*  BUN <5* 8  CREATININE 0.68 0.77  CALCIUM 7.8* 7.9*    PT/INR: No results for input(s): LABPROT, INR in the last 72 hours. ABG    Component Value Date/Time   PHART 7.347 (L) 12/17/2015 0353   HCO3 25.8 (H) 12/17/2015 0353   TCO2 27 12/17/2015 0353   O2SAT 92.0 12/17/2015 0353   CBG (last 3)  No results for input(s): GLUCAP in the last 72 hours.  Assessment/Plan: S/P Procedure(s) (LRB): Left VIDEO ASSISTED THORACOSCOPY (Left) CHEST TUBE INSERTION right (Right) Mobilize Diuresis d/c tubes/lines ready for stepdown   LOS: 6 days    Tharon Aquas Trigt III 12/19/2015

## 2015-12-19 NOTE — Progress Notes (Signed)
Started infusing azactam, pt complaining her IV site was burning and hurting. Paused medication and started infusing it through another IV. After 10 minutes pt complained pain was running from the new IV site down to her hand and she was beginning to feel nauseated.  Immediately stopped infusion. Vitals checked and stable. MD called, awaiting for him to return my call. Will continue to monitor closely.

## 2015-12-19 NOTE — Anesthesia Postprocedure Evaluation (Signed)
Anesthesia Post Note  Patient: Diana Murray  Procedure(s) Performed: Procedure(s) (LRB): Left VIDEO ASSISTED THORACOSCOPY (Left) CHEST TUBE INSERTION right (Right)  Anesthesia Post Evaluation  Last Vitals:  Vitals:   12/19/15 0900 12/19/15 1137  BP: 101/66   Pulse:    Resp: 19 (!) 22  Temp:  36.9 C    Last Pain:  Vitals:   12/19/15 1234  TempSrc:   PainSc: 5                  Diana Murray DAVID

## 2015-12-19 NOTE — Progress Notes (Signed)
Patient ID: Diana Murray, female   DOB: 1950-07-10, 65 y.o.   MRN: [id number] EVENING ROUNDS NOTE :     [address]       [place] [address]             [phone]                 3 Days Post-Op Procedure(s) (LRB): Left VIDEO ASSISTED THORACOSCOPY (Left) CHEST TUBE INSERTION right (Right)  Total Length of Stay:  LOS: 6 days  BP (!) 118/49   Pulse 100   Temp 97.7 F (36.5 C) (Oral)   Resp (!) 23   Ht 5\' 8"  (1.727 m)   Wt 199 lb 1.2 oz (90.3 kg)   SpO2 100%   BMI 30.27 kg/m   .Intake/Output      08/27 0701 - 08/28 0700 08/28 0701 - 08/29 0700   P.O.     I.V. (mL/kg) 460 (5.1)    IV Piggyback 300    Total Intake(mL/kg) 760 (8.4)    Urine (mL/kg/hr) 3655 (1.7) 525 (0.5)   Chest Tube 25 (0) 0 (0)   Total Output 3680 525   Net -2920 -525          . bupivacaine ON-Q pain pump    . lactated ringers 10 mL/hr at 12/17/15 1912     Lab Results  Component Value Date   WBC 11.9 (H) 12/19/2015   HGB 8.7 (L) 12/19/2015   HCT 28.0 (L) 12/19/2015   PLT 479 (H) 12/19/2015   GLUCOSE 104 (H) 12/19/2015   ALT 29 12/18/2015   AST 25 12/18/2015   NA 138 12/19/2015   K 3.5 12/19/2015   CL 100 (L) 12/19/2015   CREATININE 0.77 12/19/2015   BUN 8 12/19/2015   CO2 31 12/19/2015   INR 1.27 12/15/2015   Dg Chest Port 1 View  Result Date: 12/19/2015 CLINICAL DATA:  Chest tube. EXAM: PORTABLE CHEST 1 VIEW COMPARISON:  12/18/2015. FINDINGS: Interval removal of right chest tube. Right IJ line in stable position. Left chest tubes in stable position. Left upper chest tube again noted coiled in the left chest in unchanged position. No pneumothorax. Persistent left perihilar and bibasilar atelectasis and/or infiltrates. Small bilateral pleural effusions. IMPRESSION: 1. Interim removal right chest tube . Right IJ line in stable position. Left chest tubes in unchanged position. The upper left chest tube is noted coiled in the chest in unchanged position. No pneumothorax.  2. Persistent left perihilar and bibasilar atelectasis and/or infiltrates. Small bilateral pleural effusions. Electronically Signed   By: Bruning   On: 12/19/2015 07:12   Stable day, up to chair   Grace Isaac MD  Beeper [phone] Office [phone] 12/19/2015 6:20 PM

## 2015-12-19 NOTE — Care Management Note (Signed)
Case Management Note  Patient Details  Name: Diana Murray MRN: [id number] Date of Birth: 09-Feb-1951  Subjective/Objective:     Patient s/p left video assisted thoracoscopy, Right chest tube insertion.                Action/Plan: No Home health needs identified at this time. Patient down to 1 chest tube. CM will continue to monitor.   Expected Discharge Date:   to be determined               Expected Discharge Plan:   to be determined  In-House Referral:     Discharge planning Services     Post Acute Care Choice:    Choice offered to:     DME Arranged:    DME Agency:     HH Arranged:    HH Agency:     Status of Service:     If discussed at [employer], dates discussed:    Additional Comments:  Ninfa Meeker, RN 12/19/2015, 12:52 PM

## 2015-12-19 NOTE — Anesthesia Postprocedure Evaluation (Signed)
Anesthesia Post Note  Patient: Diana Murray  Procedure(s) Performed: Procedure(s) (LRB): Left VIDEO ASSISTED THORACOSCOPY (Left) CHEST TUBE INSERTION right (Right)  Patient location during evaluation: PACU Anesthesia Type: General Level of consciousness: awake and alert Pain management: pain level controlled Vital Signs Assessment: post-procedure vital signs reviewed and stable Respiratory status: spontaneous breathing, nonlabored ventilation, respiratory function stable and patient connected to nasal cannula oxygen Cardiovascular status: blood pressure returned to baseline and stable Postop Assessment: no signs of nausea or vomiting Anesthetic complications: no    Last Vitals:  Vitals:   12/19/15 0900 12/19/15 1137  BP: 101/66   Pulse:    Resp: 19 (!) 22  Temp:  36.9 C    Last Pain:  Vitals:   12/19/15 1234  TempSrc:   PainSc: 5                  Tarik Teixeira DAVID

## 2015-12-20 ENCOUNTER — Inpatient Hospital Stay (HOSPITAL_COMMUNITY): Payer: BLUE CROSS/BLUE SHIELD

## 2015-12-20 DIAGNOSIS — Z9889 Other specified postprocedural states: Secondary | ICD-10-CM

## 2015-12-20 DIAGNOSIS — J189 Pneumonia, unspecified organism: Secondary | ICD-10-CM | POA: Diagnosis present

## 2015-12-20 DIAGNOSIS — J918 Pleural effusion in other conditions classified elsewhere: Secondary | ICD-10-CM

## 2015-12-20 DIAGNOSIS — R0782 Intercostal pain: Secondary | ICD-10-CM

## 2015-12-20 LAB — BASIC METABOLIC PANEL
Anion gap: 6 (ref 5–15)
BUN: 6 mg/dL (ref 6–20)
CO2: 30 mmol/L (ref 22–32)
Calcium: 8.2 mg/dL — ABNORMAL LOW (ref 8.9–10.3)
Chloride: 100 mmol/L — ABNORMAL LOW (ref 101–111)
Creatinine, Ser: 0.67 mg/dL (ref 0.44–1.00)
GFR calc Af Amer: >60 mL/min (ref >60)
GFR calc non Af Amer: >60 mL/min (ref >60)
Glucose, Bld: 106 mg/dL — ABNORMAL HIGH (ref 65–99)
Potassium: 3.8 mmol/L (ref 3.5–5.1)
Sodium: 136 mmol/L (ref 135–145)

## 2015-12-20 LAB — CBC
HCT: 28.2 % — ABNORMAL LOW (ref 36.0–46.0)
Hemoglobin: 8.7 g/dL — ABNORMAL LOW (ref 12.0–15.0)
MCH: 26.2 pg (ref 26.0–34.0)
MCHC: 30.9 g/dL (ref 30.0–36.0)
MCV: 84.9 fL (ref 78.0–100.0)
Platelets: 493 10*3/uL — ABNORMAL HIGH (ref 150–400)
RBC: 3.32 MIL/uL — ABNORMAL LOW (ref 3.87–5.11)
RDW: 16 % — ABNORMAL HIGH (ref 11.5–15.5)
WBC: 11.4 10*3/uL — ABNORMAL HIGH (ref 4.0–10.5)

## 2015-12-20 MED ORDER — TRAMADOL HCL 50 MG PO TABS
50.0000 mg | ORAL_TABLET | Freq: Four times a day (QID) | ORAL | Status: DC
  Administered 2015-12-20 – 2015-12-22 (×8): 50 mg via ORAL
  Filled 2015-12-20 (×8): qty 1

## 2015-12-20 NOTE — Progress Notes (Signed)
4 Days Post-Op Procedure(s) (LRB): Left VIDEO ASSISTED THORACOSCOPY (Left) CHEST TUBE INSERTION right (Right) Subjective: Patient improving with less postoperative pain Last remaining chest tube removed today Chest x-ray shows improving aeration of the left lung without reaccumulation of fluid  Objective: Vital signs in last 24 hours: Temp:  [98.2 F (36.8 C)-99.4 F (37.4 C)] 98.2 F (36.8 C) (08/29 1354) Pulse Rate:  [89-95] 95 (08/29 1200) Cardiac Rhythm: Sinus tachycardia (08/29 1354) Resp:  [16-30] 16 (08/29 1200) BP: (108-132)/(49-80) 127/63 (08/29 1200) SpO2:  [89 %-99 %] 97 % (08/29 1200)  Hemodynamic parameters for last 24 hours:  stable  Intake/Output from previous day: 08/28 0701 - 08/29 0700 In: 10 [I.V.:10] Out: 2455 [Urine:2425] Intake/Output this shift: Total I/O In: 50 [I.V.:50] Out: 525 [Urine:525]       Exam    General- alert and comfortable   Lungs- clear without rales, wheezes   Cor- regular rate and rhythm, no murmur , gallop   Abdomen- soft, non-tender   Extremities - warm, non-tender, minimal edema   Neuro- oriented, appropriate, no focal weakness   Lab Results:  Recent Labs  12/19/15 0408 12/20/15 0153  WBC 11.9* 11.4*  HGB 8.7* 8.7*  HCT 28.0* 28.2*  PLT 479* 493*   BMET:  Recent Labs  12/19/15 0408 12/20/15 0153  NA 138 136  K 3.5 3.8  CL 100* 100*  CO2 31 30  GLUCOSE 104* 106*  BUN 8 6  CREATININE 0.77 0.67  CALCIUM 7.9* 8.2*    PT/INR: No results for input(s): LABPROT, INR in the last 72 hours. ABG    Component Value Date/Time   PHART 7.347 (L) 12/17/2015 0353   HCO3 25.8 (H) 12/17/2015 0353   TCO2 27 12/17/2015 0353   O2SAT 92.0 12/17/2015 0353   CBG (last 3)  No results for input(s): GLUCAP in the last 72 hours.  Assessment/Plan: S/P Procedure(s) (LRB): Left VIDEO ASSISTED THORACOSCOPY (Left) CHEST TUBE INSERTION right (Right) DC chest tube  Continue antibiotics Path negative for malignancy Cultures  of pleural peel negative   LOS: 7 days    Diana Murray 12/20/2015

## 2015-12-20 NOTE — Progress Notes (Signed)
PROGRESS NOTE    Diana Murray  [id number] DOB: 04/25/1950 DOA: 12/13/2015 PCP: Marda Stalker, PA-C   Brief Narrative:  65 y/o WF never smoker PMHx Asthma   who presented to [facility] on 8/22 with nausea / vomiting, left rib pain and chills. She reported she fell approximately 4 days prior. She hit her head on the door frame without LOC and was not evaluated at that time. Two days prior to admit, she developed chills, malaise and left sided rib pain. She was seen at her PCP's office with CXR concerning for PNA and treated with levaquin and tramadol. She tolerated the first dose of levaquin but on 7/22 she was unable to tolerate oral intake. Vomited after dose of levaquin prompted evaluation.   Initial ER CXR concerning for LLL PNA vs mass &bilateral pleural effusions with bibasilar atelectasis, WBC 18.3 and UA with pyuria. CT of the head was negative for acute process. CT of the chest demonstrated a large left pleural effusion with concern for loculation, small right pleural effusion, L>R atelectasis and suspected PNA. The patient was admitted per [facility] for community acquired pneumonia and treated with IV levaquin. Due to CT findings, PCCM consulted for evaluation.    Subjective: 8/29 A/O 4, complains of increasing SOB, and pain on the left side (chest tube just removed today).   Assessment & Plan:   Principal Problem:   CAP (community acquired pneumonia) Active Problems:   Chest pain   Leukocytosis   History of recent fall   Headache    Severe CAP w/ Acute Hypoxic Respiratory Failure -Cont empiric abx tx - clinically stabilizing   Bilateral Parapneumonic Pleural Effusions L>R -S/P Thora 8/24 w/ VATS 8/26 - post VATS care per TCTS -He CXR 8/29 slightly improved  H/O Asthma No active wheeze at this time   Positive E coli UTI Should adequately covered w/ current abx  Anemia No signs of active bleeding - due to severe illness of subacute nature    Hyperglycemia No h/o DM   DVT prophylaxis: Lovenox Code Status: Full Family Communication: Daughter present Disposition Plan: Per cardiothoracic surgery   Consultants:  PCCM TCTS   Procedures/Significant Events:  8/23 CT head W contrast: [place] 8/23 CT chest W contrast:-Large LEFT pleural effusion with suspected loculation.  -LEFT>> RIGHT atelectasis and, suspected pneumonia. 8/24 - PCCM consulted for evaluation of pleural effusion. Thora with 500 ml removed, tx to [facility]  8/25 - s/p bilateral chest tubes - Lt VATS drainage of empyema - Rt chest tube placement     Cultures   Antimicrobials: Levaquin 8/22 > Aztreonam 8/24 >   Devices    LINES / TUBES:      Continuous Infusions: . lactated ringers 10 mL/hr at 12/20/15 1200     Objective: Vitals:   12/20/15 1100 12/20/15 1137 12/20/15 1200 12/20/15 1354  BP: 116/69  127/63   Pulse: 92  95   Resp: (!) 30  16   Temp:  98.7 F (37.1 C)  98.2 F (36.8 C)  TempSrc:  Oral  Oral  SpO2: 97%  97%   Weight:      Height:        Intake/Output Summary (Last 24 hours) at 12/20/15 1642 Last data filed at 12/20/15 1200  Gross per 24 hour  Intake               60 ml  Output             2455 ml  Net            -  2395 ml   Filed Weights   12/13/15 1445 12/14/15 0000  Weight: 83.5 kg (184 lb) 90.3 kg (199 lb 1.2 oz)    Examination:  General: A/O 4, NAD, positive acute respiratory distress Eyes: negative scleral hemorrhage, negative anisocoria, negative icterus ENT: Negative Runny nose, negative gingival bleeding, Neck:  Negative scars, masses, torticollis, lymphadenopathy, JVD Lungs: Clear to auscultation all right lung fields: Decreased/absent breath sounds LLL, positive crackles RLL, negative wheezing  Cardiovascular: Regular rate and rhythm without murmur gallop or rub normal S1 and S2 Abdomen: negative abdominal pain, nondistended, positive soft, bowel sounds, no rebound, no ascites, no appreciable  mass Extremities: No significant cyanosis, clubbing, or edema bilateral lower extremities Skin: Negative rashes, lesions, ulcers Psychiatric:  Negative depression, negative anxiety, negative fatigue, negative mania  Central nervous system:  Cranial nerves II through XII intact, tongue/uvula midline, all extremities muscle strength 5/5, sensation intact throughout,  negative dysarthria, negative expressive aphasia, negative receptive aphasia.  .     Data Reviewed: Care during the described time interval was provided by me .  I have reviewed this patient's available data, including medical history, events of note, physical examination, and all test results as part of my evaluation. I have personally reviewed and interpreted all radiology studies.  CBC:  Recent Labs Lab 12/16/15 0754 12/17/15 0352 12/18/15 0405 12/19/15 0408 12/20/15 0153  WBC 12.0* 16.9* 15.3* 11.9* 11.4*  NEUTROABS 8.7* 13.6*  --   --   --   HGB 9.1* 9.3* 9.2* 8.7* 8.7*  HCT 28.7* 29.5* 29.2* 28.0* 28.2*  MCV 84.4 85.3 84.1 84.6 84.9  PLT 362 382 447* 479* [id number]*   Basic Metabolic Panel:  Recent Labs Lab 12/16/15 0754 12/17/15 0352 12/18/15 0405 12/19/15 0408 12/20/15 0153  NA 137 136 136 138 136  K 3.5 3.3* 3.5 3.5 3.8  CL 102 102 105 100* 100*  CO2 26 25 27 31 30   GLUCOSE 135* 144* 116* 104* 106*  BUN <5* <5* <5* 8 6  CREATININE 0.64 0.71 0.68 0.77 0.67  CALCIUM 8.3* 7.8* 7.8* 7.9* 8.2*  MG 1.9 1.7 1.9 2.0  --   PHOS 2.9 3.6 2.6 3.2  --    GFR: Estimated Creatinine Clearance: 83.6 mL/min (by C-G formula based on SCr of 0.8 mg/dL). Liver Function Tests:  Recent Labs Lab 12/16/15 0754 12/17/15 0352 12/18/15 0405  AST  --   --  25  ALT  --   --  29  ALKPHOS  --   --  96  BILITOT  --   --  0.6  PROT  --   --  5.0*  ALBUMIN 2.2* 1.8* 1.7*   No results for input(s): LIPASE, AMYLASE in the last 168 hours. No results for input(s): AMMONIA in the last 168 hours. Coagulation Profile:  Recent  Labs Lab 12/15/15 2315  INR 1.27   Cardiac Enzymes: No results for input(s): CKTOTAL, CKMB, CKMBINDEX, TROPONINI in the last 168 hours. BNP (last 3 results) No results for input(s): PROBNP in the last 8760 hours. HbA1C: No results for input(s): HGBA1C in the last 72 hours. CBG: No results for input(s): GLUCAP in the last 168 hours. Lipid Profile: No results for input(s): CHOL, HDL, LDLCALC, TRIG, CHOLHDL, LDLDIRECT in the last 72 hours. Thyroid Function Tests: No results for input(s): TSH, T4TOTAL, FREET4, T3FREE, THYROIDAB in the last 72 hours. Anemia Panel: No results for input(s): VITAMINB12, FOLATE, FERRITIN, TIBC, IRON, RETICCTPCT in the last 72 hours. Urine analysis:    Component Value  Date/Time   COLORURINE YELLOW 12/13/2015 1503   APPEARANCEUR CLOUDY (A) 12/13/2015 1503   LABSPEC 1.023 12/13/2015 1503   PHURINE 5.5 12/13/2015 1503   GLUCOSEU NEGATIVE 12/13/2015 1503   HGBUR TRACE (A) 12/13/2015 1503   BILIRUBINUR NEGATIVE 12/13/2015 1503   KETONESUR NEGATIVE 12/13/2015 1503   PROTEINUR 30 (A) 12/13/2015 1503   NITRITE NEGATIVE 12/13/2015 1503   LEUKOCYTESUR SMALL (A) 12/13/2015 1503   Sepsis Labs: @LABRCNTIP (procalcitonin:4,lacticidven:4)  ) Recent Results (from the past 240 hour(s))  Urine culture     Status: Abnormal   Collection Time: 12/13/15  3:03 PM  Result Value Ref Range Status   Specimen Description URINE, CLEAN CATCH  Final   Special Requests NONE  Final   Culture 10,000 COLONIES/mL ESCHERICHIA COLI (A)  Final   Report Status 12/16/2015 FINAL  Final   Organism ID, Bacteria ESCHERICHIA COLI (A)  Final      Susceptibility   Escherichia coli - MIC*    AMPICILLIN >=32 RESISTANT Resistant     CEFAZOLIN <=4 SENSITIVE Sensitive     CEFTRIAXONE <=1 SENSITIVE Sensitive     CIPROFLOXACIN >=4 RESISTANT Resistant     GENTAMICIN >=16 RESISTANT Resistant     IMIPENEM <=0.25 SENSITIVE Sensitive     NITROFURANTOIN <=16 SENSITIVE Sensitive     TRIMETH/SULFA  <=20 SENSITIVE Sensitive     AMPICILLIN/SULBACTAM 16 INTERMEDIATE Intermediate     PIP/TAZO <=4 SENSITIVE Sensitive     Extended ESBL NEGATIVE Sensitive     * 10,000 COLONIES/mL ESCHERICHIA COLI  Blood Culture (routine x 2)     Status: None   Collection Time: 12/13/15  7:30 PM  Result Value Ref Range Status   Specimen Description BLOOD RIGHT ANTECUBITAL  Final   Special Requests BOTTLES DRAWN AEROBIC AND ANAEROBIC 5 CC EACH  Final   Culture   Final    NO GROWTH 5 DAYS Performed at [facility]    Report Status 12/18/2015 FINAL  Final  Blood Culture (routine x 2)     Status: None   Collection Time: 12/13/15  8:02 PM  Result Value Ref Range Status   Specimen Description BLOOD LEFT ANTECUBITAL  Final   Special Requests BOTTLES DRAWN AEROBIC AND ANAEROBIC 5CC  Final   Culture   Final    NO GROWTH 5 DAYS Performed at [facility]    Report Status 12/18/2015 FINAL  Final  Body fluid culture     Status: None   Collection Time: 12/15/15 11:24 AM  Result Value Ref Range Status   Specimen Description PLEURAL  Final   Special Requests NONE  Final   Gram Stain   Final    MODERATE WBC PRESENT, PREDOMINANTLY PMN NO ORGANISMS SEEN    Culture   Final    No growth aerobically or anaerobically. Performed at [facility]    Report Status 12/19/2015 FINAL  Final  Acid Fast Smear (AFB)     Status: None   Collection Time: 12/15/15 11:35 AM  Result Value Ref Range Status   AFB Specimen Processing Concentration  Final   Acid Fast Smear Negative  Final    Comment: (NOTE) Performed At: [facility] [place], [place] [id number] Lindon Romp MD [id number]    Source (AFB) PLEURAL  Final  MRSA PCR Screening     Status: None   Collection Time: 12/15/15  4:48 PM  Result Value Ref Range Status   MRSA by PCR NEGATIVE NEGATIVE Final    Comment:  The GeneXpert MRSA Assay (FDA approved for NASAL specimens only), is one component of  a comprehensive MRSA colonization surveillance program. It is not intended to diagnose MRSA infection nor to guide or monitor treatment for MRSA infections.   Culture, body fluid-bottle     Status: None (Preliminary result)   Collection Time: 12/16/15  4:06 PM  Result Value Ref Range Status   Specimen Description FLUID PLEURAL LEFT  Final   Special Requests BOTTLES DRAWN AEROBIC AND ANAEROBIC 10CC  Final   Culture NO GROWTH 2 DAYS  Final   Report Status PENDING  Incomplete  Gram stain     Status: None   Collection Time: 12/16/15  4:06 PM  Result Value Ref Range Status   Specimen Description FLUID PLEURAL  Final   Special Requests NONE  Final   Gram Stain   Final    CYTOSPIN SMEAR CORRECTED ON 08/25 AT 2118: PREVIOUSLY REPORTED AS FEW WBC PRESENT, PREDOMINANTLY PMN WBC PRESENT, PREDOMINANTLY PMN CORRECTED ON 08/25 AT 2118: PREVIOUSLY REPORTED AS NO ORGANISMS SEEN NO ORGANISMS SEEN    Report Status 12/16/2015 FINAL  Final  Acid Fast Smear (AFB)     Status: None   Collection Time: 12/16/15  4:10 PM  Result Value Ref Range Status   AFB Specimen Processing Comment  Final    Comment: Tissue Grinding and Digestion/Decontamination   Acid Fast Smear Negative  Final    Comment: (NOTE) Performed At: [facility] [address] [place], [place] [id number] Lindon Romp MD [id number]    Source (AFB) TISSUE  Final    Comment: LEFT PLEURAL PEEL  Fungus Culture With Stain (Not @ [facility])     Status: None (Preliminary result)   Collection Time: 12/16/15  4:10 PM  Result Value Ref Range Status   Fungus Stain Final report  Final    Comment: (NOTE) Performed At: [facility] [place], [place] [id number] Lindon Romp MD [id number]    Fungus (Mycology) Culture PENDING  Incomplete   Fungal Source TISSUE  Final    Comment: LEFT PLEURAL PEEL  Aerobic/Anaerobic Culture (surgical/deep wound)     Status: None (Preliminary result)   Collection Time:  12/16/15  4:10 PM  Result Value Ref Range Status   Specimen Description TISSUE  Final   Special Requests PLEURAL PEEL LEFT  Final   Gram Stain   Final    FEW WBC PRESENT, PREDOMINANTLY PMN NO ORGANISMS SEEN    Culture   Final    NO GROWTH 4 DAYS NO ANAEROBES ISOLATED; CULTURE IN PROGRESS FOR 5 DAYS   Report Status PENDING  Incomplete  Fungus Culture Result     Status: None   Collection Time: 12/16/15  4:10 PM  Result Value Ref Range Status   Result 1 Comment  Final    Comment: (NOTE) KOH/Calcofluor preparation:  no fungus observed. Performed At: [facility] [place], [place] [id number] Lindon Romp MD [id number]          Radiology Studies: Dg Chest Port 1 View  Result Date: 12/20/2015 CLINICAL DATA:  Chest tube placement. EXAM: PORTABLE CHEST 1 VIEW COMPARISON:  12/19/2015. FINDINGS: Interim removal of left upper chest tube and right PICC line. Left lower chest tube in unchanged position. Stable cardiomegaly. Unchanged left mid and bibasilar lung field atelectasis and/or infiltrates. Small bilateral pleural effusions unchanged. IMPRESSION: 1. Interim removal of left upper chest tube and right PICC line. Left lower chest tube in unchanged position. No  pneumothorax. 2. Persistent unchanged left mid lung field and bibasilar atelectasis and/or infiltrates. Persistent unchanged small bilateral pleural effusions. Electronically Signed   By: Marcello Moores  Register   On: 12/20/2015 07:07   Dg Chest Port 1 View  Result Date: 12/19/2015 CLINICAL DATA:  Chest tube. EXAM: PORTABLE CHEST 1 VIEW COMPARISON:  12/18/2015. FINDINGS: Interval removal of right chest tube. Right IJ line in stable position. Left chest tubes in stable position. Left upper chest tube again noted coiled in the left chest in unchanged position. No pneumothorax. Persistent left perihilar and bibasilar atelectasis and/or infiltrates. Small bilateral pleural effusions. IMPRESSION: 1. Interim removal  right chest tube . Right IJ line in stable position. Left chest tubes in unchanged position. The upper left chest tube is noted coiled in the chest in unchanged position. No pneumothorax. 2. Persistent left perihilar and bibasilar atelectasis and/or infiltrates. Small bilateral pleural effusions. Electronically Signed   By: Marcello Moores  Register   On: 12/19/2015 07:12        Scheduled Meds: . acetaminophen  1,000 mg Oral Q6H   Or  . acetaminophen (TYLENOL) oral liquid 160 mg/5 mL  1,000 mg Oral Q6H  . benzonatate  100 mg Oral TID  . bisacodyl  10 mg Oral Daily  . calcium-vitamin D  1 tablet Oral Daily  . cholecalciferol  1,000 Units Oral Daily  . enoxaparin (LOVENOX) injection  40 mg Subcutaneous Q24H  . levofloxacin  750 mg Oral Daily  . oxybutynin  5 mg Oral Daily  . pantoprazole  40 mg Oral Q1200  . senna-docusate  1 tablet Oral QHS   Continuous Infusions: . lactated ringers 10 mL/hr at 12/20/15 1200     LOS: 7 days    Time spent: 40 minutes    Analya Louissaint, Geraldo Docker, MD [facility] Pager [phone]   If 7PM-7AM, please contact night-coverage [email or web address] Password [email or web address] 12/20/2015, 4:42 PM

## 2015-12-20 NOTE — Care Management Note (Signed)
Case Management Note  Patient Details  Name: [name] MRN: [id number] Date of Birth: 1951-04-11  Subjective/Objective:    PT recommends home health PT, discussed wuth pt, her dtr, and her parents.  Pt lives alone but her parents are available to stay with her to provide 24 hr assistance.  Pt's dtr lives in [place], [place] and would prefer that pt come to her home if pt feels she is well enough to make the trip - pt states she should know in a few days whether she will stay in the area or travel to [place] with dtr.                               Expected Discharge Plan:  [place]  Discharge planning Services  CM Consult  Status of Service:  In process, will continue to follow  Girard Cooter, RN 12/20/2015, 1:04 PM

## 2015-12-20 NOTE — Care Management Note (Addendum)
Case Management Note  Patient Details  Name: Diana Murray MRN: [id number] Date of Birth: 1950/11/12  Subjective/Objective:   NCM spoke with patient and her daughter from [place], pt eval rec hhpt for her. Patient daughter is a physical therapist as well.  Patient may go stay with daughter in [place] for a while also, but will be here in [facility] for some days first. NCM gave patient the [employer] and she chose [facility] , NCM made referral to [facility] for [place].  Will need orders from MD .  Patient also states she may go home with home oxygen if so she would also like to use [facility] for this as well.  She has a pcp and she has insurance for medications and transportation.  NCM will cont to follow for dc needs.                  Action/Plan:   Expected Discharge Date:                  Expected Discharge Plan:  [place]  In-House Referral:     Discharge planning Services  CM Consult  Post Acute Care Choice:    Choice offered to:  Patient  DME Arranged:    DME Agency:     HH Arranged:  PT [place]:  [place]  Status of Service:  In process, will continue to follow  If discussed at Long Length of Stay Meetings, dates discussed:    Additional Comments:  Zenon Mayo, RN 12/20/2015, 5:51 PM

## 2015-12-20 NOTE — Progress Notes (Signed)
Patient transferred with no distress noted. Will transfer care at this time.

## 2015-12-21 ENCOUNTER — Inpatient Hospital Stay (HOSPITAL_COMMUNITY): Payer: BLUE CROSS/BLUE SHIELD

## 2015-12-21 LAB — CULTURE, BODY FLUID W GRAM STAIN -BOTTLE: Culture: NO GROWTH

## 2015-12-21 LAB — AEROBIC/ANAEROBIC CULTURE (SURGICAL/DEEP WOUND)

## 2015-12-21 LAB — AEROBIC/ANAEROBIC CULTURE W GRAM STAIN (SURGICAL/DEEP WOUND): Culture: NO GROWTH

## 2015-12-21 MED ORDER — OXYCODONE HCL 5 MG PO TABS
5.0000 mg | ORAL_TABLET | ORAL | Status: DC | PRN
  Administered 2015-12-21 – 2015-12-22 (×4): 10 mg via ORAL
  Filled 2015-12-21 (×4): qty 2

## 2015-12-21 NOTE — Progress Notes (Signed)
[place] TEAM 1 - Stepdown/ICU TEAM  Diana Murray  [id number] DOB: 09-05-1950 DOA: 12/13/2015 PCP: Marda Stalker, PA-C    Brief Narrative:  65 y/o F never smoker w/ Hx of asthma who presented to [facility] on 8/22 with nausea / vomiting, left rib pain and chills.  She reported she fell approximately 4 days prior.  She hit her head on the door frame without LOC and was not evaluated at that time. Two days prior to admit, she developed chills, malaise and left sided rib pain.  She was seen at her PCP's office with CXR concerning for PNA and treated with levaquin and tramadol.  She tolerated the first dose of levaquin but on 7/22 she was unable to tolerate oral intake.  Vomited after dose of levaquin prompted evaluation.    Initial ER CXR concerning for LLL PNA vs mass & bilateral pleural effusions with bibasilar atelectasis, WBC 18.3 and UA with pyuria.  CT of the head was negative for acute process.  CT of the chest demonstrated a large left pleural effusion with concern for loculation, small right pleural effusion, L>R atelectasis and suspected PNA. The patient was admitted per [facility] for community acquired pneumonia and treated with IV levaquin.  Due to CT findings, PCCM consulted for evaluation.   Significant Events: 8/22 - Admit with CAP, CT concerning for loculation 8/24 - PCCM consulted for evaluation of pleural effusion.  Thora with 500 ml removed, tx to [facility]  8/25 - s/p bilateral chest tubes - L VATS drainage of empyema - R chest tube placement   Subjective: The patient is feeling much better today.  She has had difficulty with desaturations when ambulating.  She reports expected pain at her previous chest tube sites.  She denies substernal chest pressure, nausea vomiting, abdominal pain.  Assessment & Plan:  Severe CAP w/ Acute Hypoxic Respiratory Failure Stop abx course after dose today - anticipate d/c home 8/31 - will require home O2 for use w/ exertion as rapidly desats w/  slightest movement   Bilateral Parapneumonic Pleural Effusions L>R S/P Thora 8/24 w/ VATS 8/26 - post VATS care per TCTS - final chest tube removed 8/29  H/O Asthma No active wheeze at this time   E coli UTI Has completed a course of antibiotic treatment  Anemia No signs of active bleeding - due to severe illness of subacute nature   Hyperglycemia No h/o DM   DVT prophylaxis: lovenox  Code Status: FULL CODE Family Communication: No family present at time of exam  Disposition Plan: probable d/c home in 24-48hrs   Consultants:  PCCM TCTS  Antimicrobials:  Levaquin 8/22 > 8/30 Aztreonam 8/24 > 8/28  Objective: Blood pressure (!) 114/52, pulse 95, temperature 97.4 F (36.3 C), temperature source Oral, resp. rate (!) 22, height 5\' 8"  (1.727 m), weight 90.3 kg (199 lb 1.2 oz), SpO2 93 %.  Intake/Output Summary (Last 24 hours) at 12/21/15 1524 Last data filed at 12/21/15 1334  Gross per 24 hour  Intake              960 ml  Output             1650 ml  Net             -690 ml   Filed Weights   12/13/15 1445 12/14/15 0000  Weight: 83.5 kg (184 lb) 90.3 kg (199 lb 1.2 oz)    Examination: General: No acute respiratory distress at rest  Lungs: poor air  movement B bases - no wheezing  Cardiovascular: Regular rate and rhythm without murmur gallop or rub  Abdomen: Nontender, nondistended, soft, bowel sounds positive, no rebound, no ascites, no appreciable mass Extremities: No significant cyanosis, clubbing, edema bilateral lower extremities  CBC:  Recent Labs Lab 12/16/15 0754 12/17/15 0352 12/18/15 0405 12/19/15 0408 12/20/15 0153  WBC 12.0* 16.9* 15.3* 11.9* 11.4*  NEUTROABS 8.7* 13.6*  --   --   --   HGB 9.1* 9.3* 9.2* 8.7* 8.7*  HCT 28.7* 29.5* 29.2* 28.0* 28.2*  MCV 84.4 85.3 84.1 84.6 84.9  PLT 362 382 447* 479* [id number]*   Basic Metabolic Panel:  Recent Labs Lab 12/16/15 0754 12/17/15 0352 12/18/15 0405 12/19/15 0408 12/20/15 0153  NA 137 136 136 138  136  K 3.5 3.3* 3.5 3.5 3.8  CL 102 102 105 100* 100*  CO2 26 25 27 31 30   GLUCOSE 135* 144* 116* 104* 106*  BUN <5* <5* <5* 8 6  CREATININE 0.64 0.71 0.68 0.77 0.67  CALCIUM 8.3* 7.8* 7.8* 7.9* 8.2*  MG 1.9 1.7 1.9 2.0  --   PHOS 2.9 3.6 2.6 3.2  --    GFR: Estimated Creatinine Clearance: 83.6 mL/min (by C-G formula based on SCr of 0.8 mg/dL).  Liver Function Tests:  Recent Labs Lab 12/16/15 0754 12/17/15 0352 12/18/15 0405  AST  --   --  25  ALT  --   --  29  ALKPHOS  --   --  96  BILITOT  --   --  0.6  PROT  --   --  5.0*  ALBUMIN 2.2* 1.8* 1.7*   Coagulation Profile:  Recent Labs Lab 12/15/15 2315  INR 1.27    Recent Results (from the past 240 hour(s))  Urine culture     Status: Abnormal   Collection Time: 12/13/15  3:03 PM  Result Value Ref Range Status   Specimen Description URINE, CLEAN CATCH  Final   Special Requests NONE  Final   Culture 10,000 COLONIES/mL ESCHERICHIA COLI (A)  Final   Report Status 12/16/2015 FINAL  Final   Organism ID, Bacteria ESCHERICHIA COLI (A)  Final      Susceptibility   Escherichia coli - MIC*    AMPICILLIN >=32 RESISTANT Resistant     CEFAZOLIN <=4 SENSITIVE Sensitive     CEFTRIAXONE <=1 SENSITIVE Sensitive     CIPROFLOXACIN >=4 RESISTANT Resistant     GENTAMICIN >=16 RESISTANT Resistant     IMIPENEM <=0.25 SENSITIVE Sensitive     NITROFURANTOIN <=16 SENSITIVE Sensitive     TRIMETH/SULFA <=20 SENSITIVE Sensitive     AMPICILLIN/SULBACTAM 16 INTERMEDIATE Intermediate     PIP/TAZO <=4 SENSITIVE Sensitive     Extended ESBL NEGATIVE Sensitive     * 10,000 COLONIES/mL ESCHERICHIA COLI  Blood Culture (routine x 2)     Status: None   Collection Time: 12/13/15  7:30 PM  Result Value Ref Range Status   Specimen Description BLOOD RIGHT ANTECUBITAL  Final   Special Requests BOTTLES DRAWN AEROBIC AND ANAEROBIC 5 CC EACH  Final   Culture   Final    NO GROWTH 5 DAYS Performed at [facility]    Report Status 12/18/2015  FINAL  Final  Blood Culture (routine x 2)     Status: None   Collection Time: 12/13/15  8:02 PM  Result Value Ref Range Status   Specimen Description BLOOD LEFT ANTECUBITAL  Final   Special Requests BOTTLES DRAWN AEROBIC AND ANAEROBIC 5CC  Final   Culture   Final    NO GROWTH 5 DAYS Performed at [facility]    Report Status 12/18/2015 FINAL  Final  Body fluid culture     Status: None   Collection Time: 12/15/15 11:24 AM  Result Value Ref Range Status   Specimen Description PLEURAL  Final   Special Requests NONE  Final   Gram Stain   Final    MODERATE WBC PRESENT, PREDOMINANTLY PMN NO ORGANISMS SEEN    Culture   Final    No growth aerobically or anaerobically. Performed at [facility]    Report Status 12/19/2015 FINAL  Final  Acid Fast Smear (AFB)     Status: None   Collection Time: 12/15/15 11:35 AM  Result Value Ref Range Status   AFB Specimen Processing Concentration  Final   Acid Fast Smear Negative  Final    Comment: (NOTE) Performed At: [facility] [place], [place] [id number] Lindon Romp MD [id number]    Source (AFB) PLEURAL  Final  MRSA PCR Screening     Status: None   Collection Time: 12/15/15  4:48 PM  Result Value Ref Range Status   MRSA by PCR NEGATIVE NEGATIVE Final    Comment:        The GeneXpert MRSA Assay (FDA approved for NASAL specimens only), is one component of a comprehensive MRSA colonization surveillance program. It is not intended to diagnose MRSA infection nor to guide or monitor treatment for MRSA infections.   Culture, body fluid-bottle     Status: None (Preliminary result)   Collection Time: 12/16/15  4:06 PM  Result Value Ref Range Status   Specimen Description FLUID PLEURAL LEFT  Final   Special Requests BOTTLES DRAWN AEROBIC AND ANAEROBIC 10CC  Final   Culture NO GROWTH 2 DAYS  Final   Report Status PENDING  Incomplete  Gram stain     Status: None   Collection Time: 12/16/15  4:06  PM  Result Value Ref Range Status   Specimen Description FLUID PLEURAL  Final   Special Requests NONE  Final   Gram Stain   Final    CYTOSPIN SMEAR CORRECTED ON 08/25 AT 2118: PREVIOUSLY REPORTED AS FEW WBC PRESENT, PREDOMINANTLY PMN WBC PRESENT, PREDOMINANTLY PMN CORRECTED ON 08/25 AT 2118: PREVIOUSLY REPORTED AS NO ORGANISMS SEEN NO ORGANISMS SEEN    Report Status 12/16/2015 FINAL  Final  Acid Fast Smear (AFB)     Status: None   Collection Time: 12/16/15  4:10 PM  Result Value Ref Range Status   AFB Specimen Processing Comment  Final    Comment: Tissue Grinding and Digestion/Decontamination   Acid Fast Smear Negative  Final    Comment: (NOTE) Performed At: [facility] [address] [place], [place] [id number] Lindon Romp MD [id number]    Source (AFB) TISSUE  Final    Comment: LEFT PLEURAL PEEL  Fungus Culture With Stain (Not @ [facility])     Status: None (Preliminary result)   Collection Time: 12/16/15  4:10 PM  Result Value Ref Range Status   Fungus Stain Final report  Final    Comment: (NOTE) Performed At: [facility] [place], [place] [id number] Lindon Romp MD [id number]    Fungus (Mycology) Culture PENDING  Incomplete   Fungal Source TISSUE  Final    Comment: LEFT PLEURAL PEEL  Aerobic/Anaerobic Culture (surgical/deep wound)     Status: None   Collection Time: 12/16/15  4:10 PM  Result Value Ref Range Status   Specimen Description TISSUE  Final   Special Requests PLEURAL PEEL LEFT  Final   Gram Stain   Final    FEW WBC PRESENT, PREDOMINANTLY PMN NO ORGANISMS SEEN    Culture No growth aerobically or anaerobically.  Final   Report Status 12/21/2015 FINAL  Final  Fungus Culture Result     Status: None   Collection Time: 12/16/15  4:10 PM  Result Value Ref Range Status   Result 1 Comment  Final    Comment: (NOTE) KOH/Calcofluor preparation:  no fungus observed. Performed At: [facility] [place], [place] [id number] Lindon Romp MD [id number]      Scheduled Meds: . acetaminophen  1,000 mg Oral Q6H   Or  . acetaminophen (TYLENOL) oral liquid 160 mg/5 mL  1,000 mg Oral Q6H  . benzonatate  100 mg Oral TID  . bisacodyl  10 mg Oral Daily  . calcium-vitamin D  1 tablet Oral Daily  . cholecalciferol  1,000 Units Oral Daily  . enoxaparin (LOVENOX) injection  40 mg Subcutaneous Q24H  . levofloxacin  750 mg Oral Daily  . oxybutynin  5 mg Oral Daily  . pantoprazole  40 mg Oral Q1200  . senna-docusate  1 tablet Oral QHS  . traMADol  50 mg Oral Q6H     LOS: 8 days   Cherene Altes, MD [facility] Office  [phone] Pager - Text Page per Amion as per below:  On-Call/Text Page:      [email or web address]      password [email or web address]  If 7PM-7AM, please contact night-coverage [email or web address] Password [email or web address] 12/21/2015, 3:24 PM

## 2015-12-21 NOTE — Progress Notes (Signed)
Physical Therapy Treatment Patient Details Name: Diana Murray MRN: [id number] DOB: [birth year] Today's Date: 12/21/2015    History of Present Illness Patient is a 65 yo female admitted 12/13/15 with N/V, Lt rib pain.  Patient with CAP, Bil. pleural effusions L>R.  Patient s/p Lt VATS with mini thoracotomy 8/25.  Chest tubes Rt and Lt.    PMH:  Asthma    PT Comments    Patient seen for mobility progression. Tolerated increased ambulation distance without physical assist. Ambulated without device on 2 liters with saturations stable. Educated on mobility expectations and safety. Will continue with current POC, if conttinues to progress may not need HHPT.  Follow Up Recommendations  Home health PT;Supervision for mobility/OOB     Equipment Recommendations  Rolling walker with 5" wheels;3in1 (PT)    Recommendations for Other Services       Precautions / Restrictions Precautions Precautions: Fall Restrictions Weight Bearing Restrictions: No    Mobility  Bed Mobility Overal bed mobility: Needs Assistance Bed Mobility: Sit to Sidelying     Supine to sit: Supervision     General bed mobility comments: increased time to perform, no physical assist required  Transfers Overall transfer level: Needs assistance Equipment used: None Transfers: Sit to/from Stand Sit to Stand: Supervision         General transfer comment: supervision for safety, performed from bed and from toilet  Ambulation/Gait Ambulation/Gait assistance: Supervision Ambulation Distance (Feet): 640 Feet Assistive device: None Gait Pattern/deviations: Step-through pattern;Decreased stride length;Drifts right/left Gait velocity: decreased Gait velocity interpretation: Below normal speed for age/gender General Gait Details: VCs for increased cadence   Stairs            Wheelchair Mobility    Modified Rankin (Stroke Patients Only)       Balance           Standing balance support: No  upper extremity supported Standing balance-Leahy Scale: Fair                      Cognition Arousal/Alertness: Awake/alert Behavior During Therapy: WFL for tasks assessed/performed Overall Cognitive Status: Within Functional Limits for tasks assessed                      Exercises Other Exercises Other Exercises: VCs for controlled breaths and nasal inhalation during ambulation    General Comments        Pertinent Vitals/Pain Pain Assessment: 0-10 Pain Score: 6  Pain Location: left side Pain Descriptors / Indicators: Sore Pain Intervention(s): Monitored during session    Home Living                      Prior Function            PT Goals (current goals can now be found in the care plan section) Acute Rehab PT Goals Patient Stated Goal: To get stronger PT Goal Formulation: With patient/family Time For Goal Achievement: 12/25/15 Potential to Achieve Goals: Good Progress towards PT goals: Progressing toward goals    Frequency  Min 3X/week    PT Plan Current plan remains appropriate    Co-evaluation             End of Session Equipment Utilized During Treatment: Oxygen Activity Tolerance: Patient tolerated treatment well Patient left: in chair;with call bell/phone within reach;with nursing/sitter in room;with family/visitor present     Time: 1640-1701 PT Time Calculation (min) (ACUTE ONLY): 21 min  Charges:  $  Gait Training: 8-22 mins                    G CodesDuncan Dull 12-23-15, 5:16 PM Diana Murray, [place] DPT  [phone]

## 2015-12-21 NOTE — Discharge Instructions (Signed)
Thoracotomy, Care After °Refer to this sheet in the next few weeks. These instructions provide you with information on caring for yourself after your procedure. Your health care provider may also give you more specific instructions. Your treatment has been planned according to current medical practices, but problems sometimes occur. Call your health care provider if you have any problems or questions after your procedure. °WHAT TO EXPECT AFTER YOUR PROCEDURE °After your procedure, it is typical to have the following sensations: °· You may feel pain at the incision site. °· You may be constipated from the pain medicine given and the change in your level of activity. °· You may feel extremely tired. °HOME CARE INSTRUCTIONS °· Take over-the-counter or prescription medicines for pain, discomfort, or fever only as directed by your health care provider. It is very important to take pain relieving medicine before your pain becomes severe. You will be able to breathe and cough more comfortably if your pain is well controlled. °· Take deep breaths. Deep breathing helps to keep your lungs inflated and protects against a lung infection (pneumonia). °· Cough frequently. Even though coughing may cause discomfort, coughing is important to clear mucus (phlegm) and expand your lungs. Coughing helps prevent pneumonia. If it hurts to cough, hold a pillow against your chest when you cough. This may help with the discomfort. °· Continue to use an incentive spirometer as directed. The use of an incentive spirometer helps to keep your lungs inflated and protects against pneumonia. °· Change the bandages over your incision as needed or as directed by your health care provider. °· Remove the bandages over your chest tube site as directed by your health care provider. °· Resume your normal diet as directed. It is important to have adequate protein, calories, vitamins, and minerals to promote healing. °· Prevent constipation. °¨ Eat  high-fiber foods such as whole grain cereals and breads, brown rice, beans, and fresh fruits and vegetables. °¨ Drink enough water and fluids to keep your urine clear or pale yellow. Avoid drinking beverages containing caffeine. Beverages containing caffeine can cause dehydration and harden your stool. °¨ Talk to your health care provider about taking a stool softener or laxative. °· Avoid lifting until you are instructed otherwise. °· Do not drive until directed by your health care provider.  Do not drive while taking pain medicines (narcotics). °· Do not bathe, swim, or use a hot tub until directed by your health care provider. You may shower instead. Gently wash the area of your incision with water and soap as directed. Do not use anything else to clean your incision except as directed by your health care provider. °· Do not use any tobacco products including cigarettes, chewing tobacco, or electronic cigarettes. °· Avoid secondhand smoke. °· Schedule an appointment for stitch (suture) or staple removal as directed. °· Schedule and attend all follow-up visits as directed by your health care provider. It is important to keep all your appointments. °· Participate in pulmonary rehabilitation as directed by your health care provider. °· Do not travel by airplane for 2 weeks after your chest tube is removed. °SEEK MEDICAL CARE IF: °· You are bleeding from your wounds. °· Your heartbeat seems irregular. °· You have redness, swelling, or increasing pain in the wounds. °· There is pus coming from your wounds. °· There is a bad smell coming from the wound or dressing. °· You have a fever or chills. °· You have nausea or are vomiting. °· You have muscle aches. °SEEK   IMMEDIATE MEDICAL CARE IF: °· You have a rash. °· You have difficulty breathing. °· You have a reaction or side effect to medicines given. °· You have persistent nausea. °· You have lightheadedness or feel faint. °· You have shortness of breath or chest  pain. °· You have persistent pain. °  °This information is not intended to replace advice given to you by your health care provider. Make sure you discuss any questions you have with your health care provider. °  °Document Released: 09/22/2010 Document Revised: 08/24/2014 Document Reviewed: 11/26/2012 °Elsevier Interactive Patient Education ©2016 [employer] Inc[employer] ° °

## 2015-12-21 NOTE — Progress Notes (Addendum)
/       [address]       [place] [address]             [phone]      5 Days Post-Op Procedure(s) (LRB): Left VIDEO ASSISTED THORACOSCOPY (Left) CHEST TUBE INSERTION right (Right)   Subjective:  Ms. Ruggiano is feeling better today.  She questions about going to her daughters house in [place].  I told her that would be fine however she would need to make frequent stops and ambulate during the trip.    Objective: Vital signs in last 24 hours: Temp:  [97.8 F (36.6 C)-98.8 F (37.1 C)] 97.8 F (36.6 C) (08/30 0345) Pulse Rate:  [89-95] 90 (08/29 2326) Cardiac Rhythm: Normal sinus rhythm (08/30 0345) Resp:  [16-30] 17 (08/30 0345) BP: (106-127)/(55-72) 106/58 (08/30 0345) SpO2:  [95 %-99 %] 98 % (08/30 0345)  Intake/Output from previous day: 08/29 0701 - 08/30 0700 In: 770 [P.O.:720; I.V.:50] Out: 1775 [Urine:1775]  General appearance: alert, cooperative and no distress Heart: regular rate and rhythm Lungs: diminished breath sounds bibasilar Abdomen: soft, non-tender; bowel sounds normal; no masses,  no organomegaly Wound: clean and dry  Lab Results:  Recent Labs  12/19/15 0408 12/20/15 0153  WBC 11.9* 11.4*  HGB 8.7* 8.7*  HCT 28.0* 28.2*  PLT 479* 493*   BMET:  Recent Labs  12/19/15 0408 12/20/15 0153  NA 138 136  K 3.5 3.8  CL 100* 100*  CO2 31 30  GLUCOSE 104* 106*  BUN 8 6  CREATININE 0.77 0.67  CALCIUM 7.9* 8.2*    PT/INR: No results for input(s): LABPROT, INR in the last 72 hours. ABG    Component Value Date/Time   PHART 7.347 (L) 12/17/2015 0353   HCO3 25.8 (H) 12/17/2015 0353   TCO2 27 12/17/2015 0353   O2SAT 92.0 12/17/2015 0353   CBG (last 3)  No results for input(s): GLUCAP in the last 72 hours.  Assessment/Plan: S/P Procedure(s) (LRB): Left VIDEO ASSISTED THORACOSCOPY (Left) CHEST TUBE INSERTION right (Right)  1. Pulm- CXR removed yesterday, CXR is stable, no pneumothorax, atelectasis remains present 2.  Dispo- care per primary, will make 2 week follow up with Dr. Prescott Gum... Will sign off contact if further issues arise   LOS: 8 days    BARRETT, ERIN 12/21/2015  Tubes out Pathology benign Patient satisfactory for discharge Transition to oral antibiotic at home-one week Tharon Aquas trigt M.D.

## 2015-12-21 NOTE — Progress Notes (Signed)
SATURATION QUALIFICATIONS: (This note is used to comply with regulatory documentation for home oxygen)  Patient Saturations on Room Air at Rest = 88%  Patient Saturations on Room Air while Ambulating = 81%  Patient Saturations on 2 Liters of oxygen while Ambulating = 91%  Please briefly explain why patient needs home oxygen: Patient oxygen levels not sufficient during both ambulation and at rest on room air, oxygen needed

## 2015-12-22 DIAGNOSIS — A498 Other bacterial infections of unspecified site: Secondary | ICD-10-CM

## 2015-12-22 DIAGNOSIS — R072 Precordial pain: Secondary | ICD-10-CM

## 2015-12-22 DIAGNOSIS — N390 Urinary tract infection, site not specified: Secondary | ICD-10-CM | POA: Diagnosis present

## 2015-12-22 MED ORDER — OXYCODONE HCL 5 MG PO TABS: 5.0000 mg | ORAL_TABLET | ORAL | 0 refills | Status: DC | PRN

## 2015-12-22 MED ORDER — METHOCARBAMOL 500 MG PO TABS: 500.0000 mg | ORAL_TABLET | Freq: Four times a day (QID) | ORAL | 0 refills | Status: DC | PRN

## 2015-12-22 MED ORDER — LEVOFLOXACIN 500 MG PO TABS
500.0000 mg | ORAL_TABLET | Freq: Every day | ORAL | Status: DC
  Administered 2015-12-22: 500 mg via ORAL
  Filled 2015-12-22: qty 1

## 2015-12-22 MED ORDER — LEVOFLOXACIN 500 MG PO TABS: 500.0000 mg | ORAL_TABLET | Freq: Every day | ORAL | 0 refills | Status: DC

## 2015-12-22 NOTE — Discharge Summary (Signed)
Physician Discharge Summary  PRINCELLA CRAIGE [id number] DOB: July 05, 1950 DOA: 12/13/2015  PCP: Marda Stalker, PA-C  Admit date: 12/13/2015 Discharge date: 12/22/2015  Time spent: 35 minutes  Recommendations for Outpatient Follow-up:  Severe CAP w/ Acute Hypoxic Respiratory Failure -Cont empiric abx tx - clinically stabilizing  SATURATION QUALIFICATIONS: (This note is used to comply with regulatory documentation for home oxygen) Patient Saturations on Room Air at Rest = 88% Patient Saturations on Room Air while Ambulating = 81% Patient Saturations on 2 Liters of oxygen while Ambulating = 91% Please briefly explain why patient needs home oxygen: Patient oxygen levels not sufficient during both ambulation and at rest on room air, oxygen needed   Bilateral Parapneumonic Pleural Effusions L>R -S/P Thora 8/24 w/ VATS 8/26 - post VATS care per TCTS -Patient has appointment with Dr. Tharon Aquas Trigt III cardiothoracic surgery on 9/13 at 1400 -Continue levofloxacin postdischarge for 7 days  H/O Asthma -No active wheeze at this time   Positive E coli UTI -Should adequately covered w/ current abx  Anemia -No signs of active bleeding  - due to severe illness of subacute nature   Hyperglycemia -No h/o DM   Discharge Diagnoses:  Principal Problem:   CAP (community acquired pneumonia) Active Problems:   Chest pain   Leukocytosis   History of recent fall   Headache   Parapneumonic effusion   S/P thoracentesis   Status post thoracotomy   Escherichia coli infection   Urinary tract infection, site not specified   Discharge Condition: Stable  Diet recommendation: Regular  Filed Weights   12/13/15 1445 12/14/15 0000  Weight: 83.5 kg (184 lb) 90.3 kg (199 lb 1.2 oz)    History of present illness:  65 y/o WF never smoker PMHx Asthma   who presented to [facility] on 8/22 with nausea / vomiting, left rib pain and chills. She reported she fell approximately 4 days prior. She  hit her head on the door frame without LOC and was not evaluated at that time. Two days prior to admit, she developed chills, malaise and left sided rib pain. She was seen at her PCP's office with CXR concerning for PNA and treated with levaquin and tramadol. She tolerated the first dose of levaquin but on 7/22 she was unable to tolerate oral intake. Vomited after dose of levaquin promptedevaluation.   Initial ER CXR concerning for LLL PNA vs mass &bilateral pleural effusions with bibasilar atelectasis, WBC 18.3 and UA with pyuria. CT of the head was negative for acute process. CT of the chest demonstrated a large left pleural effusion with concern for loculation, small right pleural effusion, L>R atelectasis and suspected PNA. The patient was admitted per [facility] for community acquired pneumonia and treated with IV levaquin. Due to CT findings, PCCM consulted for evaluation.  During this hospitalization patient was treated for severe CAP which resulted in bilateral parapneumonic effusions requiring placement of chest tubes. After drainage of effusions and appropriate antibiotics patient improved significantly. Although significantly improved patient will require O2 at home for the foreseeable well future.      Consultants:  PCCM TCTS   Procedures/Significant Events:  8/23 CT head W contrast: [place] 8/23 CT chest W contrast:-Large LEFT pleural effusion with suspected loculation.  -LEFT>> RIGHT atelectasis and, suspected pneumonia. 8/24 - PCCM consulted for evaluation of pleural effusion. Thora with 500 ml removed, tx to [facility]  8/25- s/p bilateral chest tubes - Lt VATS drainage of empyema - Rt chest tube placement     Cultures  Antimicrobials: Levaquin 8/22 >> Aztreonam 8/24 >8/28   Discharge Exam: Vitals:   12/22/15 0325 12/22/15 0700 12/22/15 1104 12/22/15 1230  BP: 121/66 124/68 110/60 124/80  Pulse: 93 84 76   Resp: (!) 22 15 18  (!) 21  Temp: 98 F (36.7 C)  97.9 F (36.6 C) 98 F (36.7 C)   TempSrc: Oral Oral Oral   SpO2: 92% 95% 90% 92%  Weight:      Height:        General: A/O 4, NAD, positive acute respiratory distress Eyes: negative scleral hemorrhage, negative anisocoria, negative icterus ENT: Negative Runny nose, negative gingival bleeding, Neck:  Negative scars, masses, torticollis, lymphadenopathy, JVD Lungs: Clear to auscultation with diffuse crackles, negative wheezing  Cardiovascular: Regular rate and rhythm without murmur gallop or rub normal S1 and S2 Abdomen: negative abdominal pain, nondistended, positive soft, bowel sounds, no rebound, no ascites, no appreciable mass Discharge Instructions     Medication List    TAKE these medications   CALCIUM 600+D 600-400 MG-UNIT tablet Generic drug:  Calcium Carbonate-Vitamin D Take 1 tablet by mouth daily.   cholecalciferol 1000 units tablet Commonly known as:  VITAMIN D Take 1,000 Units by mouth daily.   fluticasone 50 MCG/ACT nasal spray Commonly known as:  FLONASE Place 2 sprays into both nostrils daily as needed for rhinitis.   levofloxacin 500 MG tablet Commonly known as:  LEVAQUIN Take 1 tablet (500 mg total) by mouth daily with breakfast. What changed:  medication strength  how much to take  when to take this   loratadine 10 MG tablet Commonly known as:  CLARITIN Take 10 mg by mouth daily as needed for allergies.   methocarbamol 500 MG tablet Commonly known as:  ROBAXIN Take 1 tablet (500 mg total) by mouth every 6 (six) hours as needed for muscle spasms.   oxybutynin 5 MG 24 hr tablet Commonly known as:  DITROPAN-XL Take 5 mg by mouth daily.   oxyCODONE 5 MG immediate release tablet Commonly known as:  Oxy IR/ROXICODONE Take 1 tablet (5 mg total) by mouth every 4 (four) hours as needed for moderate pain.   traMADol 50 MG tablet Commonly known as:  ULTRAM Take 50 mg by mouth every 6 (six) hours as needed for moderate pain.      Allergies   Allergen Reactions  . Penicillins Anaphylaxis    Has patient had a PCN reaction causing immediate rash, facial/tongue/throat swelling, SOB or lightheadedness with hypotension: Yes Has patient had a PCN reaction causing severe rash involving mucus membranes or skin necrosis: No Has patient had a PCN reaction that required hospitalization No Has patient had a PCN reaction occurring within the last 10 years: No If all of the above answers are "NO", then may proceed with Cephalosporin use.   Follow-up Information    Tharon Aquas Trigt III, MD Follow up on 01/04/2016.   Specialty:  Cardiothoracic Surgery Why:  Appointment is at 2:00, please get CXR at 1:30 at [facility] located on first floor of our office buidling Contact information: [address] [place] [address] [place] .   Why:  Home Health Physical Therapy Contact information: [place] [address] [place] .   Why:  will deliver concentrator to your home. Contact information: [address] [place] [address] [phone]  [place] .   Contact information: [address] [place] [address] [phone]            The results of significant diagnostics from this hospitalization (including imaging, microbiology, ancillary and laboratory) are listed below for reference.    Significant Diagnostic Studies: Dg Chest 2 View  Result Date: 12/21/2015 CLINICAL DATA:  Pneumonia with shortness of breath EXAM: CHEST  2 VIEW COMPARISON:  December 20, 2015 FINDINGS: There remains patchy infiltrate in the left perihilar region. There is stable bibasilar atelectatic change. No new opacity evident. Heart size and pulmonary vascularity are within normal limits. There is atherosclerotic calcification in the aorta. No adenopathy. There is degenerative change in  the thoracic spine. IMPRESSION: Stable areas of left perihilar patchy infiltrate and bibasilar atelectasis. No new opacity evident. Stable cardiac silhouette. Aortic atherosclerosis noted. Electronically Signed   By: Lowella Grip III M.D.   On: 12/21/2015 07:17   Dg Chest 2 View  Result Date: 12/13/2015 CLINICAL DATA:  Low-grade fever and left lower posterior chest pain. EXAM: CHEST  2 VIEW COMPARISON:  12/12/2015 FINDINGS: The cardiac silhouette is mildly enlarged. Mediastinal contours appear intact. There is no evidence of pneumothorax. There are persistent bilateral pleural effusions with bibasilar subsegmental atelectasis. More focal airspace consolidation versus pulmonary mass is seen in the left lower lobe. Osseous structures are without acute abnormality. Soft tissues are grossly normal. IMPRESSION: Airspace consolidation versus pulmonary mass in the left lower lobe. Bilateral pleural effusions with bibasilar atelectasis. Electronically Signed   By: Fidela Salisbury M.D.   On: 12/13/2015 19:13   Ct Head Wo Contrast  Result Date: 12/14/2015 CLINICAL DATA:  Fall 10 days ago with headaches, initial encounter EXAM: CT HEAD WITHOUT CONTRAST TECHNIQUE: Contiguous axial images were obtained from the base of the skull through the vertex without intravenous contrast. COMPARISON:  None. FINDINGS: Brain: No evidence of acute infarction, hemorrhage, hydrocephalus, extra-axial collection or mass lesion/mass effect. Left basal ganglia calcifications are seen. Vascular: No hyperdense vessel or unexpected calcification. Skull: Within normal limits. Sinuses/Orbits: No acute finding. IMPRESSION: No acute intracranial abnormality noted. Electronically Signed   By: Inez Catalina M.D.   On: 12/14/2015 09:18   Ct Chest W Contrast  Result Date: 12/14/2015 CLINICAL DATA:  LEFT pleuritic pain beginning December 09, 2015, shortness of breath and cough. Follow-up abnormal chest radiograph. EXAM: CT CHEST WITH CONTRAST  TECHNIQUE: Multidetector CT imaging of the chest was performed during intravenous contrast administration. CONTRAST:  72mL ISOVUE-300 IOPAMIDOL (ISOVUE-300) INJECTION 61% COMPARISON:  Chest radiograph August 22nd 2017 FINDINGS: Cardiovascular: Heart size is normal. Trace pericardial effusion. Thoracic aorta is normal course and caliber. Though not tailored for evaluation, no central pulmonary embolism. Mediastinum/Nodes: Calcified RIGHT hilar lymph nodes. Fat stranding anterior mediastinal. Multiple sub cm lymph nodes are likely reactive without lymphadenopathy by CT size criteria. Lungs/Pleura: Large lobulated LEFT pleural effusion LEFT lower lobe hypo enhancing consolidation. Associated air bronchograms, no discrete mass. LEFT upper lobe and lingular collapse. Small RIGHT pleural effusion and RIGHT lower lobe hypo enhancing consolidation with air bronchograms. Tiny calcified granulomas RIGHT lower lobe. Tracheobronchial tree is patent. No pneumothorax. Upper Abdomen: 13 mm cyst in spleen. No acute upper abdominal process. Musculoskeletal: Included soft tissues are normal. No acute osseous process. IMPRESSION: Large LEFT pleural effusion with suspected loculation. Small RIGHT pleural effusion. LEFT greater than RIGHT atelectasis and, suspected pneumonia. Electronically Signed   By: Elon Alas M.D.   On: 12/14/2015 15:42   Dg  Chest Port 1 View  Result Date: 12/20/2015 CLINICAL DATA:  SOB, cough. Patient with left side chest tube removed today. Increasing SOB with negative breath sounds at the base evaluate for new pneumothorax. Hx of asthma. Nonsmoker. EXAM: PORTABLE CHEST 1 VIEW COMPARISON:  12/20/2015 FINDINGS: Shallow inspiration with atelectasis in lung bases. Left perihilar infiltration slightly improved since previous study. Heart size is increased. Small bilateral pleural effusions. No pneumothorax. Left chest tube removed since previous study. IMPRESSION: Persistent bibasilar atelectasis and  effusions. Cardiac enlargement. Mild improvement of left perihilar infiltration Electronically Signed   By: Lucienne Capers M.D.   On: 12/20/2015 18:14   Dg Chest Port 1 View  Result Date: 12/20/2015 CLINICAL DATA:  Chest tube placement. EXAM: PORTABLE CHEST 1 VIEW COMPARISON:  12/19/2015. FINDINGS: Interim removal of left upper chest tube and right PICC line. Left lower chest tube in unchanged position. Stable cardiomegaly. Unchanged left mid and bibasilar lung field atelectasis and/or infiltrates. Small bilateral pleural effusions unchanged. IMPRESSION: 1. Interim removal of left upper chest tube and right PICC line. Left lower chest tube in unchanged position. No pneumothorax. 2. Persistent unchanged left mid lung field and bibasilar atelectasis and/or infiltrates. Persistent unchanged small bilateral pleural effusions. Electronically Signed   By: Marcello Moores  Register   On: 12/20/2015 07:07   Dg Chest Port 1 View  Result Date: 12/19/2015 CLINICAL DATA:  Chest tube. EXAM: PORTABLE CHEST 1 VIEW COMPARISON:  12/18/2015. FINDINGS: Interval removal of right chest tube. Right IJ line in stable position. Left chest tubes in stable position. Left upper chest tube again noted coiled in the left chest in unchanged position. No pneumothorax. Persistent left perihilar and bibasilar atelectasis and/or infiltrates. Small bilateral pleural effusions. IMPRESSION: 1. Interim removal right chest tube . Right IJ line in stable position. Left chest tubes in unchanged position. The upper left chest tube is noted coiled in the chest in unchanged position. No pneumothorax. 2. Persistent left perihilar and bibasilar atelectasis and/or infiltrates. Small bilateral pleural effusions. Electronically Signed   By: Marcello Moores  Register   On: 12/19/2015 07:12   Dg Chest Port 1 View  Result Date: 12/18/2015 CLINICAL DATA:  LEFT SIDED VATS WITH CT INSERTION 8/25 EXAM: PORTABLE CHEST 1 VIEW COMPARISON:  12/17/2015 FINDINGS: LEFT and RIGHT  chest tubes unchanged. Interval decrease in pleural effusion on the LEFT. Decrease in vascular congestion. Bibasilar atelectasis noted. No pneumothorax. IMPRESSION: 1. Chest tubes in place without pneumothorax. 2. Decrease in LEFT pleural effusions. 3. Improved vascular congestion. Electronically Signed   By: Suzy Bouchard M.D.   On: 12/18/2015 07:18   Dg Chest Port 1 View  Result Date: 12/17/2015 CLINICAL DATA:  Post thoracoscopy EXAM: PORTABLE CHEST 1 VIEW COMPARISON:  December 16, 2015 FINDINGS: The heart size and mediastinal contours are stable. The heart size is enlarged. Right central venous line and bilateral chest tubes are unchanged. There is interval developed patchy consolidation throughout the left lung with relative sparing of the left apex. Patchy consolidation of the medial perihilar right lung is also identified. There is no pleural line to suggest pneumothorax. The visualized skeletal structures are stable. IMPRESSION: Interval developed patchy consolidation throughout the left lung with relative sparing of the left apex probably due to a combination of increasing pleural effusion and underlying pulmonary consolidation question pneumonia. Increased patchy consolidation of the right perihilar region pneumonia is not excluded. Electronically Signed   By: Abelardo Diesel M.D.   On: 12/17/2015 07:50   Dg Chest Port 1 View  Result Date:  12/16/2015 CLINICAL DATA:  Followup exam.  Chest tube in place. EXAM: PORTABLE CHEST 1 VIEW COMPARISON:  12/16/2015 at 1711 hours FINDINGS: Endotracheal tube has been removed since the prior exam. The 2 left-sided chest tubes are stable. New right inferior hemi thorax chest tube. Tip projects just above the right hemidiaphragm. Side hole of the tube projects just outside the thoracic cage. There has been a significant decrease in right pleural fluid following the right chest tube placement. No convincing pneumothorax. Left lung base opacity is similar to the prior  study. Stable right internal jugular central venous line. IMPRESSION: 1. New right inferior hemi thorax chest tube. There has been a significant decrease in right pleural fluid with clearing of much of the right lung base opacity. No convincing pneumothorax. 2. No change in the 2 left-sided chest tubes or in the appearance of the left lung. 3. Status post removal of the endotracheal tube. Electronically Signed   By: Lajean Manes M.D.   On: 12/16/2015 18:31   Dg Chest Portable 1 View  Result Date: 12/16/2015 CLINICAL DATA:  Pole effusion versus pneumothorax. EXAM: PORTABLE CHEST 1 VIEW COMPARISON:  12/15/2015 FINDINGS: Two left-sided chest tubes. Small left pleural effusion. Small-moderate right pleural effusion. Bilateral interstitial thickening. No pneumothorax. Right jugular central venous catheter with the tip projecting over the SVC. Dual-lumen endotracheal tube with the proximal port 2 cm with the carina and the second tube in the left main stem bronchus. Bilateral diffuse interstitial thickening likely reflecting mild edema. Stable cardiomediastinal silhouette. No acute osseous abnormality. IMPRESSION: 1. Two left-sided chest tubes. Small left pleural effusion. Small-moderate right pleural effusion. Bilateral interstitial thickening. No pneumothorax. 2. Right jugular central venous catheter with the tip projecting over the SVC. 3. Dual-lumen endotracheal tube with the proximal port 2 cm with the carina and the second tube in the left main stem bronchus. Electronically Signed   By: Kathreen Devoid   On: 12/16/2015 17:32   Dg Chest Port 1 View  Result Date: 12/15/2015 CLINICAL DATA:  Cough and shortness of breath EXAM: PORTABLE CHEST 1 VIEW COMPARISON:  Chest radiograph December 13, 2015; chest CT December 14, 2015 FINDINGS: There is persistent pleural effusion on the left with left lower lobe consolidation. There is atelectatic change on the right with a rather minimal right pleural effusion. Heart is upper  normal in size with pulmonary vascularity within normal limits. No adenopathy. No bone lesions. IMPRESSION: Sizable left pleural effusion with left lower lobe consolidation. Mild right base atelectasis with small right pleural effusion. Stable cardiac silhouette. The degree of consolidation and effusion on the left have increased compared to 2 days prior and appears similar to 1 day prior. Electronically Signed   By: Lowella Grip III M.D.   On: 12/15/2015 12:11    Microbiology: Recent Results (from the past 240 hour(s))  Urine culture     Status: Abnormal   Collection Time: 12/13/15  3:03 PM  Result Value Ref Range Status   Specimen Description URINE, CLEAN CATCH  Final   Special Requests NONE  Final   Culture 10,000 COLONIES/mL ESCHERICHIA COLI (A)  Final   Report Status 12/16/2015 FINAL  Final   Organism ID, Bacteria ESCHERICHIA COLI (A)  Final      Susceptibility   Escherichia coli - MIC*    AMPICILLIN >=32 RESISTANT Resistant     CEFAZOLIN <=4 SENSITIVE Sensitive     CEFTRIAXONE <=1 SENSITIVE Sensitive     CIPROFLOXACIN >=4 RESISTANT Resistant  GENTAMICIN >=16 RESISTANT Resistant     IMIPENEM <=0.25 SENSITIVE Sensitive     NITROFURANTOIN <=16 SENSITIVE Sensitive     TRIMETH/SULFA <=20 SENSITIVE Sensitive     AMPICILLIN/SULBACTAM 16 INTERMEDIATE Intermediate     PIP/TAZO <=4 SENSITIVE Sensitive     Extended ESBL NEGATIVE Sensitive     * 10,000 COLONIES/mL ESCHERICHIA COLI  Blood Culture (routine x 2)     Status: None   Collection Time: 12/13/15  7:30 PM  Result Value Ref Range Status   Specimen Description BLOOD RIGHT ANTECUBITAL  Final   Special Requests BOTTLES DRAWN AEROBIC AND ANAEROBIC 5 CC EACH  Final   Culture   Final    NO GROWTH 5 DAYS Performed at [facility]    Report Status 12/18/2015 FINAL  Final  Blood Culture (routine x 2)     Status: None   Collection Time: 12/13/15  8:02 PM  Result Value Ref Range Status   Specimen Description BLOOD LEFT  ANTECUBITAL  Final   Special Requests BOTTLES DRAWN AEROBIC AND ANAEROBIC 5CC  Final   Culture   Final    NO GROWTH 5 DAYS Performed at [facility]    Report Status 12/18/2015 FINAL  Final  Body fluid culture     Status: None   Collection Time: 12/15/15 11:24 AM  Result Value Ref Range Status   Specimen Description PLEURAL  Final   Special Requests NONE  Final   Gram Stain   Final    MODERATE WBC PRESENT, PREDOMINANTLY PMN NO ORGANISMS SEEN    Culture   Final    No growth aerobically or anaerobically. Performed at [facility]    Report Status 12/19/2015 FINAL  Final  Acid Fast Smear (AFB)     Status: None   Collection Time: 12/15/15 11:35 AM  Result Value Ref Range Status   AFB Specimen Processing Concentration  Final   Acid Fast Smear Negative  Final    Comment: (NOTE) Performed At: [facility] [place], [place] [id number] Lindon Romp MD [id number]    Source (AFB) PLEURAL  Final  MRSA PCR Screening     Status: None   Collection Time: 12/15/15  4:48 PM  Result Value Ref Range Status   MRSA by PCR NEGATIVE NEGATIVE Final    Comment:        The GeneXpert MRSA Assay (FDA approved for NASAL specimens only), is one component of a comprehensive MRSA colonization surveillance program. It is not intended to diagnose MRSA infection nor to guide or monitor treatment for MRSA infections.   Culture, body fluid-bottle     Status: None   Collection Time: 12/16/15  4:06 PM  Result Value Ref Range Status   Specimen Description FLUID PLEURAL LEFT  Final   Special Requests BOTTLES DRAWN AEROBIC AND ANAEROBIC 10CC  Final   Culture NO GROWTH 5 DAYS  Final   Report Status 12/21/2015 FINAL  Final  Gram stain     Status: None   Collection Time: 12/16/15  4:06 PM  Result Value Ref Range Status   Specimen Description FLUID PLEURAL  Final   Special Requests NONE  Final   Gram Stain   Final    CYTOSPIN SMEAR CORRECTED ON 08/25 AT 2118:  PREVIOUSLY REPORTED AS FEW WBC PRESENT, PREDOMINANTLY PMN WBC PRESENT, PREDOMINANTLY PMN CORRECTED ON 08/25 AT 2118: PREVIOUSLY REPORTED AS NO ORGANISMS SEEN NO ORGANISMS SEEN    Report Status 12/16/2015 FINAL  Final  Acid Fast Smear (AFB)     Status: None   Collection Time: 12/16/15  4:10 PM  Result Value Ref Range Status   AFB Specimen Processing Comment  Final    Comment: Tissue Grinding and Digestion/Decontamination   Acid Fast Smear Negative  Final    Comment: (NOTE) Performed At: [facility] [address] [place], [place] [id number] Lindon Romp MD [id number]    Source (AFB) TISSUE  Final    Comment: LEFT PLEURAL PEEL  Fungus Culture With Stain (Not @ [facility])     Status: None (Preliminary result)   Collection Time: 12/16/15  4:10 PM  Result Value Ref Range Status   Fungus Stain Final report  Final    Comment: (NOTE) Performed At: [facility] [place], [place] [id number] Lindon Romp MD [id number]    Fungus (Mycology) Culture PENDING  Incomplete   Fungal Source TISSUE  Final    Comment: LEFT PLEURAL PEEL  Aerobic/Anaerobic Culture (surgical/deep wound)     Status: None   Collection Time: 12/16/15  4:10 PM  Result Value Ref Range Status   Specimen Description TISSUE  Final   Special Requests PLEURAL PEEL LEFT  Final   Gram Stain   Final    FEW WBC PRESENT, PREDOMINANTLY PMN NO ORGANISMS SEEN    Culture No growth aerobically or anaerobically.  Final   Report Status 12/21/2015 FINAL  Final  Fungus Culture Result     Status: None   Collection Time: 12/16/15  4:10 PM  Result Value Ref Range Status   Result 1 Comment  Final    Comment: (NOTE) KOH/Calcofluor preparation:  no fungus observed. Performed At: [facility] [place], [place] [id number] Lindon Romp MD [id number]      Labs: Basic Metabolic Panel:  Recent Labs Lab 12/16/15 0754 12/17/15 0352 12/18/15 0405 12/19/15 0408  12/20/15 0153  NA 137 136 136 138 136  K 3.5 3.3* 3.5 3.5 3.8  CL 102 102 105 100* 100*  CO2 26 25 27 31 30   GLUCOSE 135* 144* 116* 104* 106*  BUN <5* <5* <5* 8 6  CREATININE 0.64 0.71 0.68 0.77 0.67  CALCIUM 8.3* 7.8* 7.8* 7.9* 8.2*  MG 1.9 1.7 1.9 2.0  --   PHOS 2.9 3.6 2.6 3.2  --    Liver Function Tests:  Recent Labs Lab 12/16/15 0754 12/17/15 0352 12/18/15 0405  AST  --   --  25  ALT  --   --  29  ALKPHOS  --   --  96  BILITOT  --   --  0.6  PROT  --   --  5.0*  ALBUMIN 2.2* 1.8* 1.7*   No results for input(s): LIPASE, AMYLASE in the last 168 hours. No results for input(s): AMMONIA in the last 168 hours. CBC:  Recent Labs Lab 12/16/15 0754 12/17/15 0352 12/18/15 0405 12/19/15 0408 12/20/15 0153  WBC 12.0* 16.9* 15.3* 11.9* 11.4*  NEUTROABS 8.7* 13.6*  --   --   --   HGB 9.1* 9.3* 9.2* 8.7* 8.7*  HCT 28.7* 29.5* 29.2* 28.0* 28.2*  MCV 84.4 85.3 84.1 84.6 84.9  PLT 362 382 447* 479* 493*   Cardiac Enzymes: No results for input(s): CKTOTAL, CKMB, CKMBINDEX, TROPONINI in the last 168 hours. BNP: BNP (last 3 results) No results for input(s): BNP in the last 8760 hours.  ProBNP (last 3 results) No results for input(s): PROBNP in the last 8760 hours.  CBG:  No results for input(s): GLUCAP in the last 168 hours.     Signed:  Dia Crawford, MD [facility] [phone] pager

## 2015-12-22 NOTE — Care Management Note (Addendum)
Case Management Note  Patient Details  Name: Diana Murray MRN: [id number] Date of Birth: Apr 19, 1951  Subjective/Objective:    Bilateral parapneumonic pleural effusions, UTI                Action/Plan: Discharge Planning:  Referral- Home Health and Physical Therapy  NCM spoke to pt and offered choice for [facility]. Pt requesting [facility] for HH. (please see previous NCM notes). Pt requesting RW and 3n1 for home. Contacted [facility] DME rep for equipment. Explained to pt she will receive a portable tank to dc home and [facility] will deliver concentrator. Contacted [place] Liaison for [facility] PT referral.   PCP -Marda Stalker PA  Expected Discharge Date:  12/22/2015               Expected Discharge Plan:  [place]  In-House Referral:  NA  Discharge planning Services  CM Consult  Post Acute Care Choice:  Home Health Choice offered to:  Patient  DME Arranged:  3-N-1, Walker rolling, Oxygen DME Agency:  [place]:  PT [place] Agency:  [place]  Status of Service:  Completed, signed off  If discussed at [place] of Stay Meetings, dates discussed:    Additional Comments:  Erenest Rasher, RN 12/22/2015, 9:53 AM   12/22/2015 1500 Pt received DME equipment to her room, oxygen, 3n1 and RW. Jonnie Finner RN CCM Case Mgmt phone [phone]

## 2015-12-22 NOTE — Progress Notes (Signed)
6 Days Post-Op Procedure(s) (LRB): Left VIDEO ASSISTED THORACOSCOPY (Left) CHEST TUBE INSERTION right (Right) Subjective: Patient continues to improve after left VATS and drainage of empyema Chest x-ray shows no significant recurrence with improving atelectasis Surgical incision healing well Chest tube sites clean and dry-leave chest tube sutures for removal later in the office  Objective: Vital signs in last 24 hours: Temp:  [97.9 F (36.6 C)-98.3 F (36.8 C)] 98 F (36.7 C) (08/31 1104) Pulse Rate:  [76-107] 76 (08/31 1104) Cardiac Rhythm: Normal sinus rhythm (08/31 1230) Resp:  [15-29] 21 (08/31 1230) BP: (97-124)/(53-80) 124/80 (08/31 1230) SpO2:  [90 %-96 %] 92 % (08/31 1230)  Hemodynamic parameters for last 24 hours:  afebrile sinus rhythm  Intake/Output from previous day: 08/30 0701 - 08/31 0700 In: 480 [P.O.:480] Out: 400 [Urine:400] Intake/Output this shift: Total I/O In: 120 [P.O.:120] Out: -   Exam Breath sounds clear bilaterally Sinus rhythm Abdomen soft No pedal edema  Lab Results:  Recent Labs  12/20/15 0153  WBC 11.4*  HGB 8.7*  HCT 28.2*  PLT 493*   BMET:  Recent Labs  12/20/15 0153  NA 136  K 3.8  CL 100*  CO2 30  GLUCOSE 106*  BUN 6  CREATININE 0.67  CALCIUM 8.2*    PT/INR: No results for input(s): LABPROT, INR in the last 72 hours. ABG    Component Value Date/Time   PHART 7.347 (L) 12/17/2015 0353   HCO3 25.8 (H) 12/17/2015 0353   TCO2 27 12/17/2015 0353   O2SAT 92.0 12/17/2015 0353   CBG (last 3)  No results for input(s): GLUCAP in the last 72 hours.  Assessment/Plan: S/P Procedure(s) (LRB): Left VIDEO ASSISTED THORACOSCOPY (Left) CHEST TUBE INSERTION right (Right) We'll plan outpatient follow-up in office  Wound care instructions and activity limitations reviewed with patient Continue 1 week of oral Levaquin at discharge    Tharon Aquas Trigt III 12/22/2015

## 2015-12-22 NOTE — Progress Notes (Signed)
Pt. Received order to d/c. Iv's removed, belongings packed, sent home with walker, bedside commode, oxygen, education. Pt. Friend came to pick up.

## 2015-12-23 ENCOUNTER — Other Ambulatory Visit: Payer: Self-pay | Admitting: *Deleted

## 2015-12-23 DIAGNOSIS — R059 Cough, unspecified: Secondary | ICD-10-CM

## 2015-12-23 DIAGNOSIS — R05 Cough: Secondary | ICD-10-CM

## 2015-12-23 MED ORDER — BENZONATATE 200 MG PO CAPS: 200.0000 mg | ORAL_CAPSULE | Freq: Three times a day (TID) | ORAL | 0 refills | Status: DC | PRN

## 2015-12-28 ENCOUNTER — Other Ambulatory Visit: Payer: Self-pay

## 2015-12-28 DIAGNOSIS — G8918 Other acute postprocedural pain: Secondary | ICD-10-CM

## 2015-12-28 MED ORDER — OXYCODONE-ACETAMINOPHEN 5-325 MG PO TABS: 1.0000 | ORAL_TABLET | Freq: Three times a day (TID) | ORAL | 0 refills | Status: DC | PRN

## 2015-12-28 NOTE — Telephone Encounter (Signed)
RX for oxyCodone/ace 5/325mg  1 tab every 8hrs prn/pain #30 no refills.

## 2016-01-03 ENCOUNTER — Other Ambulatory Visit: Payer: Self-pay | Admitting: Cardiothoracic Surgery

## 2016-01-03 DIAGNOSIS — J918 Pleural effusion in other conditions classified elsewhere: Principal | ICD-10-CM

## 2016-01-03 DIAGNOSIS — J189 Pneumonia, unspecified organism: Secondary | ICD-10-CM

## 2016-01-04 ENCOUNTER — Ambulatory Visit
Admission: RE | Admit: 2016-01-04 | Discharge: 2016-01-04 | Disposition: A | Discharge status: Home / Self Care | Payer: BLUE CROSS/BLUE SHIELD | Source: Ambulatory Visit | Attending: Cardiothoracic Surgery | Admitting: Cardiothoracic Surgery

## 2016-01-04 ENCOUNTER — Encounter: Payer: Self-pay | Admitting: Cardiothoracic Surgery

## 2016-01-04 ENCOUNTER — Ambulatory Visit (INDEPENDENT_AMBULATORY_CARE_PROVIDER_SITE_OTHER): Payer: Self-pay | Admitting: Cardiothoracic Surgery

## 2016-01-04 VITALS — BP 123/74 | HR 85 | Resp 20 | Ht 68.0 in | Wt 199.0 lb

## 2016-01-04 DIAGNOSIS — Z09 Encounter for follow-up examination after completed treatment for conditions other than malignant neoplasm: Secondary | ICD-10-CM

## 2016-01-04 DIAGNOSIS — J90 Pleural effusion, not elsewhere classified: Secondary | ICD-10-CM

## 2016-01-04 DIAGNOSIS — J869 Pyothorax without fistula: Secondary | ICD-10-CM

## 2016-01-04 DIAGNOSIS — J918 Pleural effusion in other conditions classified elsewhere: Principal | ICD-10-CM

## 2016-01-04 DIAGNOSIS — J189 Pneumonia, unspecified organism: Secondary | ICD-10-CM

## 2016-01-04 DIAGNOSIS — J948 Other specified pleural conditions: Secondary | ICD-10-CM

## 2016-01-04 NOTE — Progress Notes (Signed)
PCP is Marda Stalker, PA-C Referring Provider is Debbe Odea, MD  Chief Complaint  Patient presents with  . Routine Post Op    2 week f/u from surgery with CXR s/p Left VATS, drainage empyema,Placement of right chest tube for right pleural effusion 12/16/2015    HPI: 2 week follow-up after left VATS for drainage and decortication of empyema and left chest tube placement for drainage of pleural effusion The patient was discharge home on oxygen. She finished a course of oral Levaquin Operative cultures have remained negative. She has had no fever. The surgical incisions healing well. She's had no cough. Her main complaint is with incisional and postthoracotomy pain and insomnia. Chest x-ray taken today is clear without recurrent effusion or airspace disease.  I walked  the patient to 250 feet in the office and her oxygen saturation on  room   air remained normal-96% we will stop her home oxygen.  Past Medical History:  Diagnosis Date  . Asthma   . Seasonal allergies     Past Surgical History:  Procedure Laterality Date  . CHEST TUBE INSERTION Right 12/16/2015   Procedure: CHEST TUBE INSERTION right;  Surgeon: Ivin Poot, MD;  Location: [place];  Service: Thoracic;  Laterality: Right;  Marland Kitchen VIDEO ASSISTED THORACOSCOPY Left 12/16/2015   Procedure: Left VIDEO ASSISTED THORACOSCOPY;  Surgeon: Ivin Poot, MD;  Location: [place];  Service: Thoracic;  Laterality: Left;    No family history on file.  Social History Social History  Substance Use Topics  . Smoking status: Never Smoker  . Smokeless tobacco: Never Used  . Alcohol use 1.2 oz/week    2 Glasses of wine per week    Current Outpatient Prescriptions  Medication Sig Dispense Refill  . benzonatate (TESSALON) 200 MG capsule Take 1 capsule (200 mg total) by mouth 3 (three) times daily as needed for cough. 30 capsule 0  . Calcium Carbonate-Vitamin D (CALCIUM 600+D) 600-400 MG-UNIT tablet Take 1 tablet by mouth daily.     . cholecalciferol (VITAMIN D) 1000 units tablet Take 1,000 Units by mouth daily.    . fluticasone (FLONASE) 50 MCG/ACT nasal spray Place 2 sprays into both nostrils daily as needed for rhinitis.    Marland Kitchen loratadine (CLARITIN) 10 MG tablet Take 10 mg by mouth daily as needed for allergies.    . methocarbamol (ROBAXIN) 500 MG tablet Take 1 tablet (500 mg total) by mouth every 6 (six) hours as needed for muscle spasms. 20 tablet 0  . oxybutynin (DITROPAN-XL) 5 MG 24 hr tablet Take 5 mg by mouth daily.    Marland Kitchen oxyCODONE-acetaminophen (PERCOCET/ROXICET) 5-325 MG tablet Take 1 tablet by mouth every 8 (eight) hours as needed for severe pain. 30 tablet 0   No current facility-administered medications for this visit.     Allergies  Allergen Reactions  . Penicillins Anaphylaxis    Has patient had a PCN reaction causing immediate rash, facial/tongue/throat swelling, SOB or lightheadedness with hypotension: Yes Has patient had a PCN reaction causing severe rash involving mucus membranes or skin necrosis: No Has patient had a PCN reaction that required hospitalization No Has patient had a PCN reaction occurring within the last 10 years: No If all of the above answers are "NO", then may proceed with Cephalosporin use.    Review of Systems  Not able to sleep more than hour at night She attributes her insomnia to pain Oxycodone not helping her pain  BP 123/74 (BP Location: Left Arm, Patient Position: Sitting,  Cuff Size: Normal)   Pulse 85   Resp 20   Ht 5\' 8"  (1.727 m)   Wt 199 lb (90.3 kg)   SpO2 97% Comment: 2L per [place]  BMI 30.26 kg/m  Physical Exam      Exam    General- alert and comfortable   Lungs- clear without rales, wheezes   Left VATS incision well-healed, chest tube sutures removed   Cor- regular rate and rhythm, no murmur , gallop   Abdomen- soft, non-tender   Extremities - warm, non-tender, minimal edema   Neuro- oriented, appropriate, no focal weakness   Diagnostic Tests: Chest  x-ray clear  Impression: Doing well after recent VATS except for postthoracotomy pain and insomnia. She'll need to have her pain medication change, we'll add Voltaren gel and add Ambien for insomnia  Plan: Stop oxycodone. Use hydromorphone 2 mg daily at bedtime as needed for pain. Use tramadol during the day Return for office visit with chest x-ray to monitor progress in 4 weeks  Len Childs, MD [facility] [phone]

## 2016-01-13 LAB — FUNGUS CULTURE RESULT

## 2016-01-13 LAB — FUNGUS CULTURE WITH STAIN

## 2016-01-13 LAB — FUNGAL ORGANISM REFLEX

## 2016-01-16 LAB — ACID FAST SMEAR (AFB, MYCOBACTERIA): Acid Fast Smear: NEGATIVE

## 2016-01-25 DIAGNOSIS — J309 Allergic rhinitis, unspecified: Secondary | ICD-10-CM | POA: Diagnosis not present

## 2016-01-25 DIAGNOSIS — R7309 Other abnormal glucose: Secondary | ICD-10-CM | POA: Diagnosis not present

## 2016-01-25 DIAGNOSIS — E785 Hyperlipidemia, unspecified: Secondary | ICD-10-CM | POA: Diagnosis not present

## 2016-01-25 DIAGNOSIS — F419 Anxiety disorder, unspecified: Secondary | ICD-10-CM | POA: Diagnosis not present

## 2016-01-25 DIAGNOSIS — N3281 Overactive bladder: Secondary | ICD-10-CM | POA: Diagnosis not present

## 2016-01-25 DIAGNOSIS — Z124 Encounter for screening for malignant neoplasm of cervix: Secondary | ICD-10-CM | POA: Diagnosis not present

## 2016-01-27 DIAGNOSIS — Z01419 Encounter for gynecological examination (general) (routine) without abnormal findings: Secondary | ICD-10-CM | POA: Diagnosis not present

## 2016-01-27 LAB — ACID FAST CULTURE WITH REFLEXED SENSITIVITIES (MYCOBACTERIA): Acid Fast Culture: NEGATIVE

## 2016-02-03 ENCOUNTER — Other Ambulatory Visit: Payer: Self-pay | Admitting: *Deleted

## 2016-02-03 DIAGNOSIS — M792 Neuralgia and neuritis, unspecified: Secondary | ICD-10-CM

## 2016-02-03 MED ORDER — GABAPENTIN 300 MG PO CAPS
300.0000 mg | ORAL_CAPSULE | Freq: Every day | ORAL | 1 refills | Status: DC
Start: 2016-02-03 — End: 2016-03-07

## 2016-02-08 ENCOUNTER — Ambulatory Visit: Payer: BLUE CROSS/BLUE SHIELD | Admitting: Cardiothoracic Surgery

## 2016-02-16 LAB — ACID FAST CULTURE WITH REFLEXED SENSITIVITIES: ACID FAST CULTURE - AFSCU3: NEGATIVE

## 2016-02-21 ENCOUNTER — Other Ambulatory Visit: Payer: Self-pay | Admitting: Cardiothoracic Surgery

## 2016-02-21 DIAGNOSIS — J189 Pneumonia, unspecified organism: Principal | ICD-10-CM

## 2016-02-21 DIAGNOSIS — J918 Pleural effusion in other conditions classified elsewhere: Secondary | ICD-10-CM

## 2016-02-22 ENCOUNTER — Ambulatory Visit: Payer: Self-pay | Admitting: Cardiothoracic Surgery

## 2016-02-24 ENCOUNTER — Telehealth: Payer: Self-pay | Admitting: *Deleted

## 2016-02-24 DIAGNOSIS — R0602 Shortness of breath: Secondary | ICD-10-CM | POA: Diagnosis not present

## 2016-02-24 DIAGNOSIS — F5101 Primary insomnia: Secondary | ICD-10-CM | POA: Diagnosis not present

## 2016-02-24 DIAGNOSIS — Z23 Encounter for immunization: Secondary | ICD-10-CM | POA: Diagnosis not present

## 2016-02-24 DIAGNOSIS — D649 Anemia, unspecified: Secondary | ICD-10-CM | POA: Diagnosis not present

## 2016-02-24 DIAGNOSIS — J9811 Atelectasis: Secondary | ICD-10-CM | POA: Diagnosis not present

## 2016-02-24 DIAGNOSIS — R7309 Other abnormal glucose: Secondary | ICD-10-CM | POA: Diagnosis not present

## 2016-02-24 NOTE — Telephone Encounter (Signed)
Mrs. Diana Murray called the office to relate a recent visit to her PCP with a complaint of shortness of breath. He did a chest xray and said "she had an infection at the base of her lung". He has given her a course of Levaquin. She is to see Dr. Nils Pyle  on 03/07/16. I said that if her PCP hadn't already gotten a repeat CXR, that the one for Dr. Lucianne Lei Trigt's visit would show any improvement. She agreed.

## 2016-03-07 ENCOUNTER — Ambulatory Visit
Admission: RE | Admit: 2016-03-07 | Discharge: 2016-03-07 | Disposition: A | Discharge status: Home / Self Care | Payer: BLUE CROSS/BLUE SHIELD | Source: Ambulatory Visit | Attending: Cardiothoracic Surgery | Admitting: Cardiothoracic Surgery

## 2016-03-07 ENCOUNTER — Ambulatory Visit (INDEPENDENT_AMBULATORY_CARE_PROVIDER_SITE_OTHER): Payer: Self-pay | Admitting: Cardiothoracic Surgery

## 2016-03-07 VITALS — BP 115/72 | HR 80 | Resp 20 | Ht 68.0 in

## 2016-03-07 DIAGNOSIS — J869 Pyothorax without fistula: Secondary | ICD-10-CM

## 2016-03-07 DIAGNOSIS — Z09 Encounter for follow-up examination after completed treatment for conditions other than malignant neoplasm: Secondary | ICD-10-CM

## 2016-03-07 DIAGNOSIS — J90 Pleural effusion, not elsewhere classified: Secondary | ICD-10-CM

## 2016-03-07 DIAGNOSIS — J918 Pleural effusion in other conditions classified elsewhere: Secondary | ICD-10-CM

## 2016-03-07 DIAGNOSIS — J189 Pneumonia, unspecified organism: Secondary | ICD-10-CM

## 2016-03-07 NOTE — Progress Notes (Signed)
PCP is Marda Stalker, PA-C Referring Provider is Debbe Odea, MD  Chief Complaint  Patient presents with  . Routine Post Op    1 month f/u with CXR    HPI: 1 month follow-up after left VATS for drainage of loculated effusion-empyema. Operative cultures were negative but the patient had been on preoperative antibiotics She has some early problems with postthoracotomy pain, insomnia and weakness. She is now much better. She is off narcotics. She is back at work Her energy and appetite are improved Chest x-ray today shows no evidence recurrent empyema-effusion with some pleural thickening at the left costophrenic angle  Past Medical History:  Diagnosis Date  . Asthma   . Seasonal allergies     Past Surgical History:  Procedure Laterality Date  . CHEST TUBE INSERTION Right 12/16/2015   Procedure: CHEST TUBE INSERTION right;  Surgeon: Ivin Poot, MD;  Location: [place];  Service: Thoracic;  Laterality: Right;  Marland Kitchen VIDEO ASSISTED THORACOSCOPY Left 12/16/2015   Procedure: Left VIDEO ASSISTED THORACOSCOPY;  Surgeon: Ivin Poot, MD;  Location: [place];  Service: Thoracic;  Laterality: Left;    No family history on file.  Social History Social History  Substance Use Topics  . Smoking status: Never Smoker  . Smokeless tobacco: Never Used  . Alcohol use 1.2 oz/week    2 Glasses of wine per week    Current Outpatient Prescriptions  Medication Sig Dispense Refill  . benzonatate (TESSALON) 200 MG capsule Take 1 capsule (200 mg total) by mouth 3 (three) times daily as needed for cough. 30 capsule 0  . Calcium Carbonate-Vitamin D (CALCIUM 600+D) 600-400 MG-UNIT tablet Take 1 tablet by mouth daily.    . cholecalciferol (VITAMIN D) 1000 units tablet Take 1,000 Units by mouth daily.    . fluticasone (FLONASE) 50 MCG/ACT nasal spray Place 2 sprays into both nostrils daily as needed for rhinitis.    Marland Kitchen loratadine (CLARITIN) 10 MG tablet Take 10 mg by mouth daily as needed for  allergies.    Marland Kitchen oxybutynin (DITROPAN-XL) 5 MG 24 hr tablet Take 5 mg by mouth daily.     No current facility-administered medications for this visit.     Allergies  Allergen Reactions  . Penicillins Anaphylaxis    Has patient had a PCN reaction causing immediate rash, facial/tongue/throat swelling, SOB or lightheadedness with hypotension: Yes Has patient had a PCN reaction causing severe rash involving mucus membranes or skin necrosis: No Has patient had a PCN reaction that required hospitalization No Has patient had a PCN reaction occurring within the last 10 years: No If all of the above answers are "NO", then may proceed with Cephalosporin use.    Review of Systems  No fever No productive cough No shortness of breath  BP 115/72 (BP Location: Right Arm, Patient Position: Sitting, Cuff Size: Normal)   Pulse 80   Resp 20   Ht 5\' 8"  (1.727 m)   SpO2 95% Comment: RA Physical Exam      Exam    General- alert and comfortable   Lungs- clear without rales, wheezes   Cor- regular rate and rhythm, no murmur , gallop   Abdomen- soft, non-tender   Extremities - warm, non-tender, minimal edema   Neuro- oriented, appropriate, no focal weakness   Diagnostic Tests: Chest x-ray clear with some postop pleural thickening at the left costophrenic angle  Impression: Excellent recovery after left VATS for loculated effusion-empyema  Plan: She may return to normal daily activities ,  driving, but should not return to do vigorous exercise at the fitness center until after the first  of the year   Len Childs, MD [facility] [phone]

## 2016-03-26 DIAGNOSIS — D649 Anemia, unspecified: Secondary | ICD-10-CM | POA: Diagnosis not present

## 2016-03-26 DIAGNOSIS — R918 Other nonspecific abnormal finding of lung field: Secondary | ICD-10-CM | POA: Diagnosis not present

## 2016-03-26 DIAGNOSIS — R911 Solitary pulmonary nodule: Secondary | ICD-10-CM | POA: Diagnosis not present

## 2016-03-26 DIAGNOSIS — J181 Lobar pneumonia, unspecified organism: Secondary | ICD-10-CM | POA: Diagnosis not present

## 2016-03-28 ENCOUNTER — Other Ambulatory Visit: Payer: Self-pay | Admitting: Cardiothoracic Surgery

## 2016-03-28 DIAGNOSIS — M792 Neuralgia and neuritis, unspecified: Secondary | ICD-10-CM

## 2016-04-04 DIAGNOSIS — J209 Acute bronchitis, unspecified: Secondary | ICD-10-CM | POA: Diagnosis not present

## 2016-04-04 DIAGNOSIS — R05 Cough: Secondary | ICD-10-CM | POA: Diagnosis not present

## 2016-04-04 DIAGNOSIS — Z978 Presence of other specified devices: Secondary | ICD-10-CM | POA: Diagnosis not present

## 2016-04-04 DIAGNOSIS — J019 Acute sinusitis, unspecified: Secondary | ICD-10-CM | POA: Diagnosis not present

## 2016-05-11 ENCOUNTER — Other Ambulatory Visit: Payer: Self-pay | Admitting: *Deleted

## 2016-05-11 DIAGNOSIS — Z9889 Other specified postprocedural states: Secondary | ICD-10-CM

## 2016-05-11 DIAGNOSIS — R911 Solitary pulmonary nodule: Secondary | ICD-10-CM

## 2016-06-05 ENCOUNTER — Other Ambulatory Visit: Payer: Self-pay | Admitting: *Deleted

## 2016-06-05 DIAGNOSIS — Z9889 Other specified postprocedural states: Secondary | ICD-10-CM

## 2016-06-06 ENCOUNTER — Ambulatory Visit (INDEPENDENT_AMBULATORY_CARE_PROVIDER_SITE_OTHER): Payer: PPO | Admitting: Cardiothoracic Surgery

## 2016-06-06 ENCOUNTER — Ambulatory Visit
Admission: RE | Admit: 2016-06-06 | Discharge: 2016-06-06 | Disposition: A | Discharge status: Home / Self Care | Payer: PPO | Source: Ambulatory Visit | Attending: Cardiothoracic Surgery | Admitting: Cardiothoracic Surgery

## 2016-06-06 ENCOUNTER — Other Ambulatory Visit: Payer: Self-pay

## 2016-06-06 VITALS — BP 115/88 | HR 86 | Resp 16 | Ht 68.0 in

## 2016-06-06 DIAGNOSIS — Z09 Encounter for follow-up examination after completed treatment for conditions other than malignant neoplasm: Secondary | ICD-10-CM | POA: Diagnosis not present

## 2016-06-06 DIAGNOSIS — J869 Pyothorax without fistula: Secondary | ICD-10-CM | POA: Diagnosis not present

## 2016-06-06 DIAGNOSIS — Z4682 Encounter for fitting and adjustment of non-vascular catheter: Secondary | ICD-10-CM | POA: Diagnosis not present

## 2016-06-06 DIAGNOSIS — Z9889 Other specified postprocedural states: Secondary | ICD-10-CM

## 2016-06-06 DIAGNOSIS — J90 Pleural effusion, not elsewhere classified: Secondary | ICD-10-CM | POA: Diagnosis not present

## 2016-06-06 NOTE — Progress Notes (Signed)
PCP is Marda Stalker, PA-C Referring Provider is Debbe Odea, MD  Chief Complaint  Patient presents with  . Routine Post Op    f/u with CXR    HPI: Returns with CXR or final followup after Left VATS last fall Relates concern about a R lung nodule seen by primary MD on postop CXR Current CXR shows resolution of R effusion- empyema,  Small calcified granuloma in R mid lung. This correlates with preop CT scan showing scattered granulomata of R lung Patient feels great and is back to work No pulmonary symptoms  Past Medical History:  Diagnosis Date  . Asthma   . Seasonal allergies     Past Surgical History:  Procedure Laterality Date  . CHEST TUBE INSERTION Right 12/16/2015   Procedure: CHEST TUBE INSERTION right;  Surgeon: Ivin Poot, MD;  Location: [place];  Service: Thoracic;  Laterality: Right;  Marland Kitchen VIDEO ASSISTED THORACOSCOPY Left 12/16/2015   Procedure: Left VIDEO ASSISTED THORACOSCOPY;  Surgeon: Ivin Poot, MD;  Location: [place];  Service: Thoracic;  Laterality: Left;    No family history on file.  Social History Social History  Substance Use Topics  . Smoking status: Never Smoker  . Smokeless tobacco: Never Used  . Alcohol use 1.2 oz/week    2 Glasses of wine per week    Current Outpatient Prescriptions  Medication Sig Dispense Refill  . Calcium Carbonate-Vitamin D (CALCIUM 600+D) 600-400 MG-UNIT tablet Take 1 tablet by mouth daily.    . cholecalciferol (VITAMIN D) 1000 units tablet Take 1,000 Units by mouth daily.    . fluticasone (FLONASE) 50 MCG/ACT nasal spray Place 2 sprays into both nostrils daily as needed for rhinitis.    Marland Kitchen loratadine (CLARITIN) 10 MG tablet Take 10 mg by mouth daily as needed for allergies.    Marland Kitchen oxybutynin (DITROPAN-XL) 5 MG 24 hr tablet Take 5 mg by mouth daily.     No current facility-administered medications for this visit.     Allergies  Allergen Reactions  . Penicillins Anaphylaxis    Has patient had a PCN reaction  causing immediate rash, facial/tongue/throat swelling, SOB or lightheadedness with hypotension: Yes Has patient had a PCN reaction causing severe rash involving mucus membranes or skin necrosis: No Has patient had a PCN reaction that required hospitalization No Has patient had a PCN reaction occurring within the last 10 years: No If all of the above answers are "NO", then may proceed with Cephalosporin use.    Review of Systems  No fever No SOB No postop pain BP 115/88 (BP Location: Left Arm, Patient Position: Sitting, Cuff Size: Large)   Pulse 86   Resp 16   Ht 5\' 8"  (1.727 m)   SpO2 96% Comment: ON RA Physical Exam      Exam    General- alert and comfortable   Lungs- clear without rales, wheezes   Cor- regular rate and rhythm, no murmur , gallop   Abdomen- soft, non-tender   Extremities - warm, non-tender, minimal edema   Neuro- oriented, appropriate, no focal weakness   Diagnostic Tests: CXR today clear with mils L base pleural thickening  Impression: Doing well after VATS for empyema last year  Plan: f/u prn   Len Childs, MD [facility] [phone]

## 2016-06-26 DIAGNOSIS — Z1231 Encounter for screening mammogram for malignant neoplasm of breast: Secondary | ICD-10-CM | POA: Diagnosis not present

## 2016-06-26 DIAGNOSIS — Z13820 Encounter for screening for osteoporosis: Secondary | ICD-10-CM | POA: Diagnosis not present

## 2016-06-26 DIAGNOSIS — D509 Iron deficiency anemia, unspecified: Secondary | ICD-10-CM | POA: Diagnosis not present

## 2016-06-26 DIAGNOSIS — F5101 Primary insomnia: Secondary | ICD-10-CM | POA: Diagnosis not present

## 2016-06-26 DIAGNOSIS — G43009 Migraine without aura, not intractable, without status migrainosus: Secondary | ICD-10-CM | POA: Diagnosis not present

## 2016-08-16 DIAGNOSIS — Z1231 Encounter for screening mammogram for malignant neoplasm of breast: Secondary | ICD-10-CM | POA: Diagnosis not present

## 2016-08-16 DIAGNOSIS — Z780 Asymptomatic menopausal state: Secondary | ICD-10-CM | POA: Diagnosis not present

## 2016-08-16 DIAGNOSIS — Z13820 Encounter for screening for osteoporosis: Secondary | ICD-10-CM | POA: Diagnosis not present

## 2016-08-28 DIAGNOSIS — G43009 Migraine without aura, not intractable, without status migrainosus: Secondary | ICD-10-CM | POA: Diagnosis not present

## 2016-08-28 DIAGNOSIS — J301 Allergic rhinitis due to pollen: Secondary | ICD-10-CM | POA: Diagnosis not present

## 2016-08-28 DIAGNOSIS — J019 Acute sinusitis, unspecified: Secondary | ICD-10-CM | POA: Diagnosis not present

## 2016-11-20 DIAGNOSIS — H524 Presbyopia: Secondary | ICD-10-CM | POA: Diagnosis not present

## 2016-11-20 DIAGNOSIS — H2513 Age-related nuclear cataract, bilateral: Secondary | ICD-10-CM | POA: Diagnosis not present

## 2016-12-07 DIAGNOSIS — L259 Unspecified contact dermatitis, unspecified cause: Secondary | ICD-10-CM | POA: Diagnosis not present

## 2016-12-27 DIAGNOSIS — D509 Iron deficiency anemia, unspecified: Secondary | ICD-10-CM | POA: Diagnosis not present

## 2016-12-27 DIAGNOSIS — J301 Allergic rhinitis due to pollen: Secondary | ICD-10-CM | POA: Diagnosis not present

## 2016-12-27 DIAGNOSIS — F5101 Primary insomnia: Secondary | ICD-10-CM | POA: Diagnosis not present

## 2016-12-27 DIAGNOSIS — E559 Vitamin D deficiency, unspecified: Secondary | ICD-10-CM | POA: Diagnosis not present

## 2016-12-27 DIAGNOSIS — Z23 Encounter for immunization: Secondary | ICD-10-CM | POA: Diagnosis not present

## 2016-12-27 DIAGNOSIS — G43009 Migraine without aura, not intractable, without status migrainosus: Secondary | ICD-10-CM | POA: Diagnosis not present

## 2016-12-27 DIAGNOSIS — Z1159 Encounter for screening for other viral diseases: Secondary | ICD-10-CM | POA: Diagnosis not present

## 2017-01-14 DIAGNOSIS — G4733 Obstructive sleep apnea (adult) (pediatric): Secondary | ICD-10-CM | POA: Diagnosis not present

## 2017-02-27 DIAGNOSIS — G4733 Obstructive sleep apnea (adult) (pediatric): Secondary | ICD-10-CM | POA: Diagnosis not present

## 2017-03-07 DIAGNOSIS — Z23 Encounter for immunization: Secondary | ICD-10-CM | POA: Diagnosis not present

## 2017-03-07 DIAGNOSIS — L814 Other melanin hyperpigmentation: Secondary | ICD-10-CM | POA: Diagnosis not present

## 2017-03-07 DIAGNOSIS — G4733 Obstructive sleep apnea (adult) (pediatric): Secondary | ICD-10-CM | POA: Diagnosis not present

## 2017-03-07 DIAGNOSIS — L718 Other rosacea: Secondary | ICD-10-CM | POA: Diagnosis not present

## 2017-03-07 DIAGNOSIS — L821 Other seborrheic keratosis: Secondary | ICD-10-CM | POA: Diagnosis not present

## 2017-03-07 DIAGNOSIS — D1801 Hemangioma of skin and subcutaneous tissue: Secondary | ICD-10-CM | POA: Diagnosis not present

## 2017-03-07 DIAGNOSIS — Z85820 Personal history of malignant melanoma of skin: Secondary | ICD-10-CM | POA: Diagnosis not present

## 2018-06-18 IMAGING — CT CT HEAD W/O CM
3 series · 17 of 47 positions shown, 20 images · non-contrast
Comparison: None.

CLINICAL DATA: Fall 10 days ago with headaches, initial encounter

EXAM:
CT HEAD WITHOUT CONTRAST
TECHNIQUE: Contiguous axial images were obtained from the base of the skull
through the vertex without intravenous contrast.

[Series 2: headseq 4.8 h45s · axial · 0.43mm/px · z∈[-37,+93]mm · 11 of 33 slices shown, 14 images]
[im 3/33  brain]
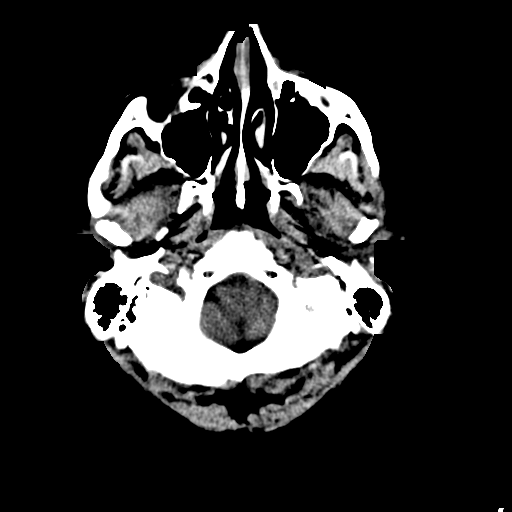
[im 3/33  bone]
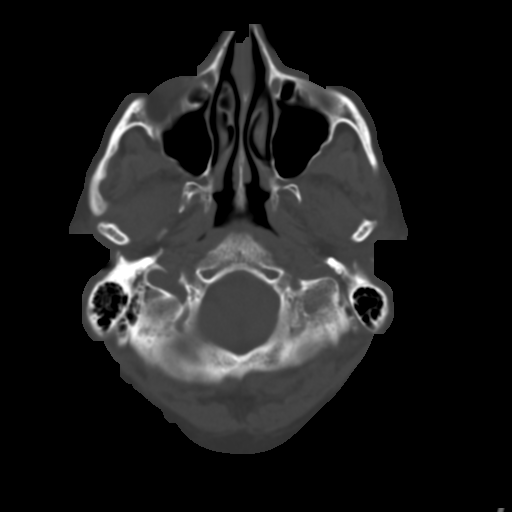
[im 5/33  brain]
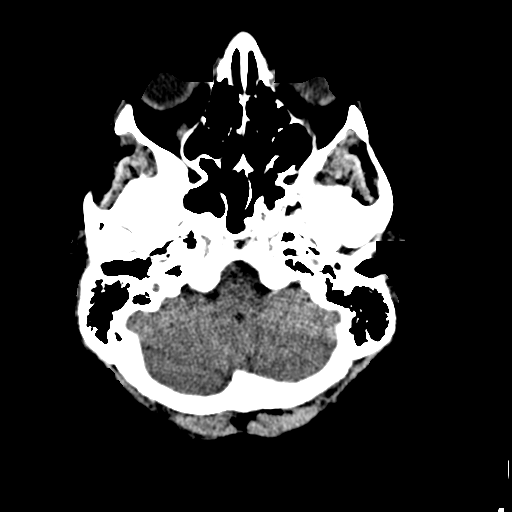
[im 8/33  brain]
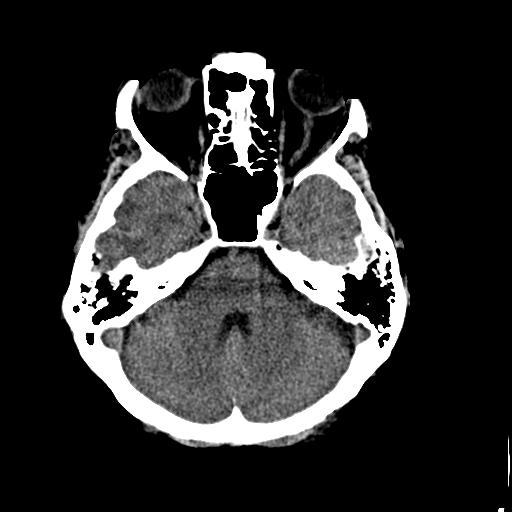
[im 10/33  brain]
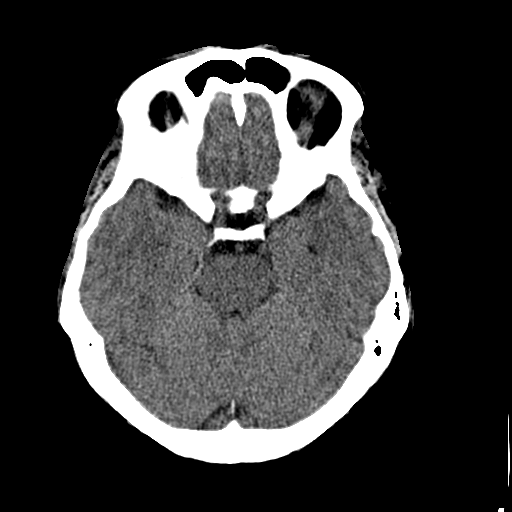
[im 14/33  brain]
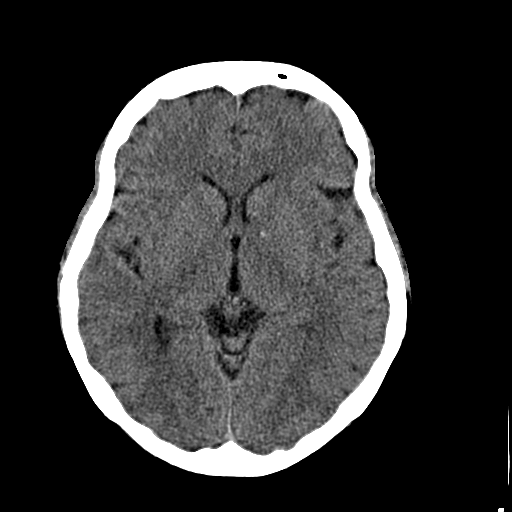
[im 14/33  bone]
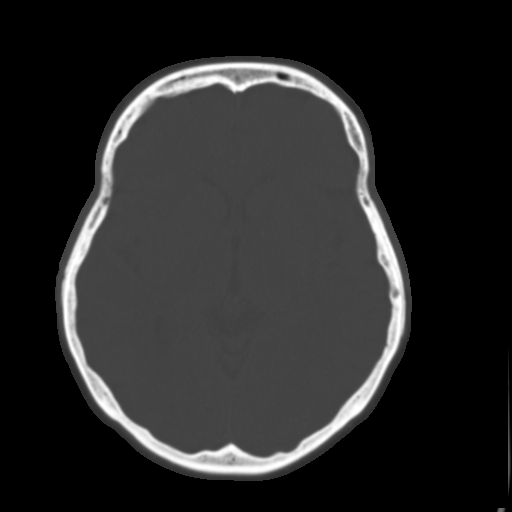
[im 17/33  brain]
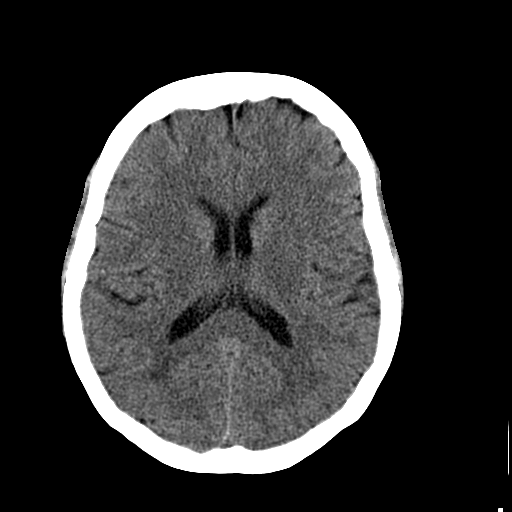
[im 19/33  brain]
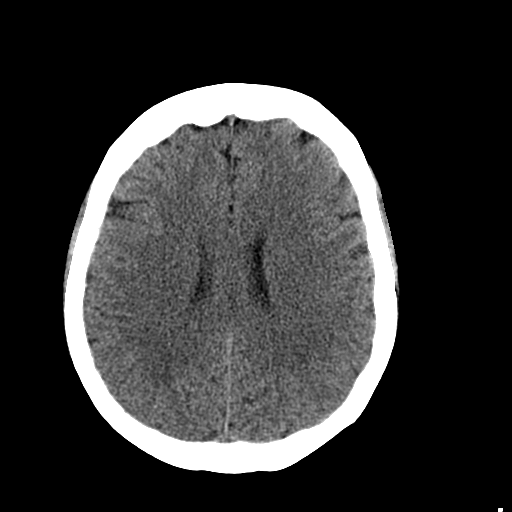
[im 23/33  brain]
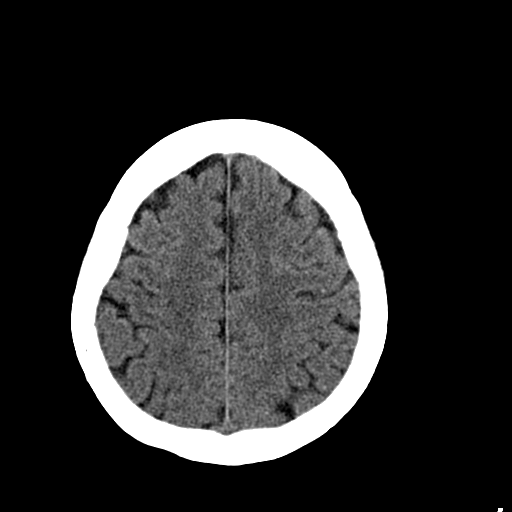
[im 25/33  brain]
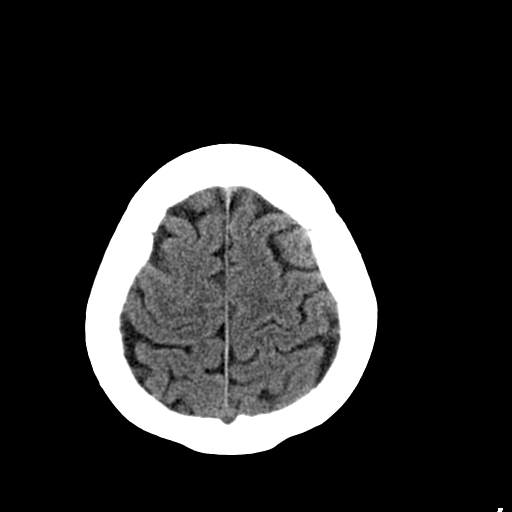
[im 25/33  bone]
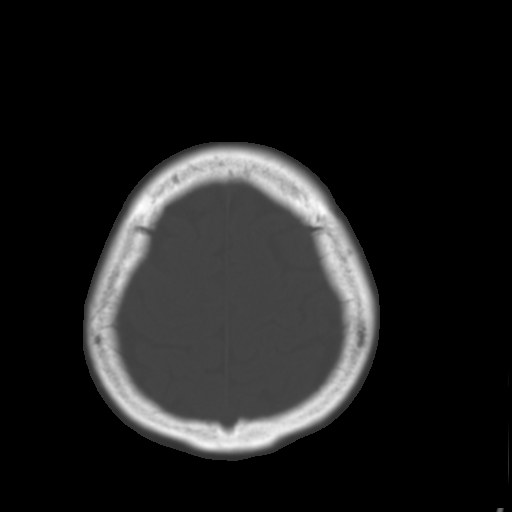
[im 28/33  brain]
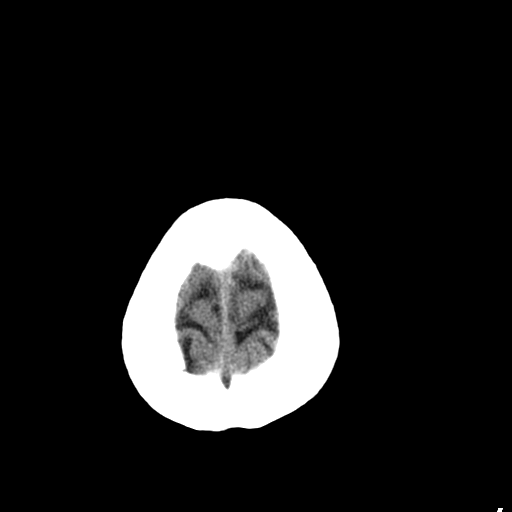
[im 30/33  brain]
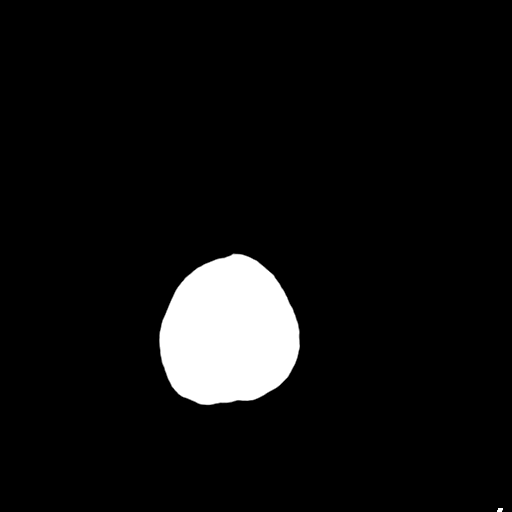

[Series 602: <mpr thick range> · coronal · 0.50mm/px · 3 of 91 slices shown]
[im 31/91  brain]
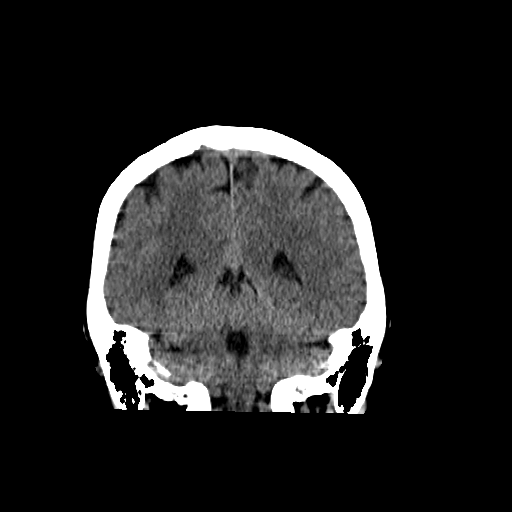
[im 41/91  brain]
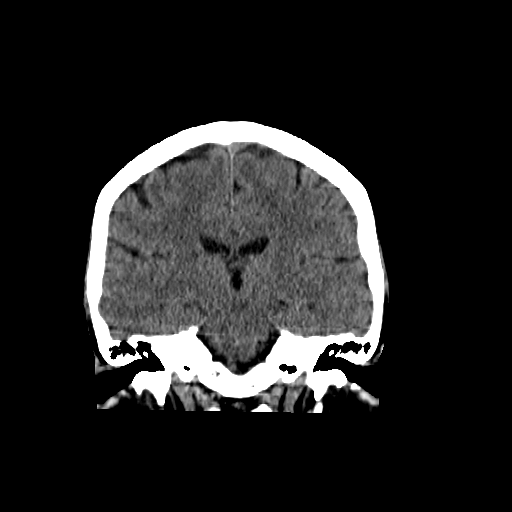
[im 51/91  brain]
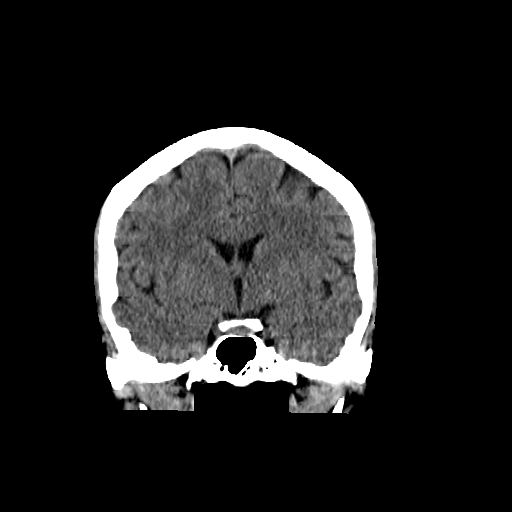

[Series 603: <mpr thick range(1)> · sagittal · 0.50mm/px · 3 of 83 slices shown]
[im 28/83  brain]
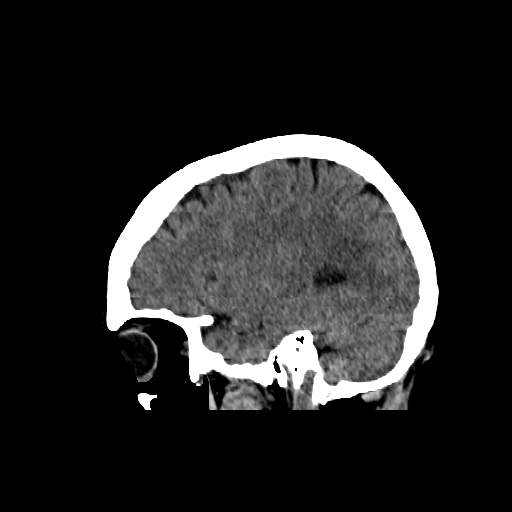
[im 42/83  brain]
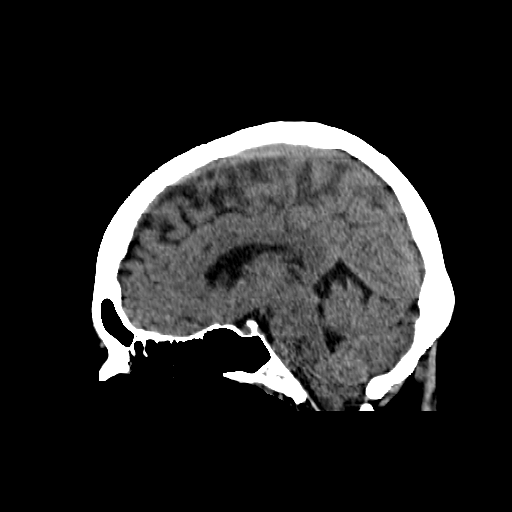
[im 55/83  brain]
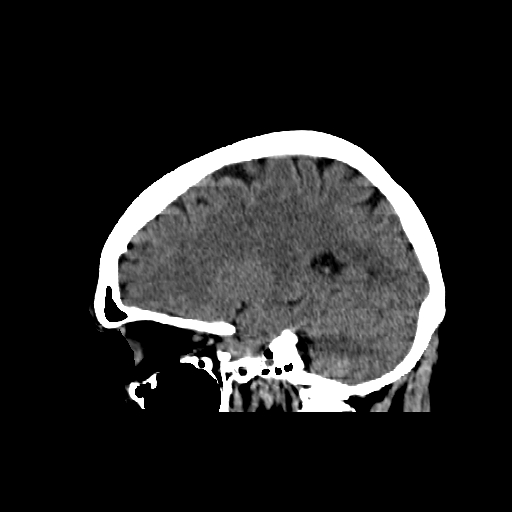

[17 of 47 positions shown; findings below may reference images not displayed]

FINDINGS: Brain: No evidence of acute infarction, hemorrhage, hydrocephalus,
extra-axial collection or mass lesion/mass effect. Left basal
ganglia calcifications are seen.

Vascular: No hyperdense vessel or unexpected calcification.

Skull: Within normal limits.

Sinuses/Orbits: No acute finding.
IMPRESSION: No acute intracranial abnormality noted.

## 2018-06-20 IMAGING — CR DG CHEST 1V PORT
1 series · 1 of 1 positions shown · non-contrast
Comparison: 12/15/2015

CLINICAL DATA: Pole effusion versus pneumothorax.

EXAM:
PORTABLE CHEST 1 VIEW

[AP]
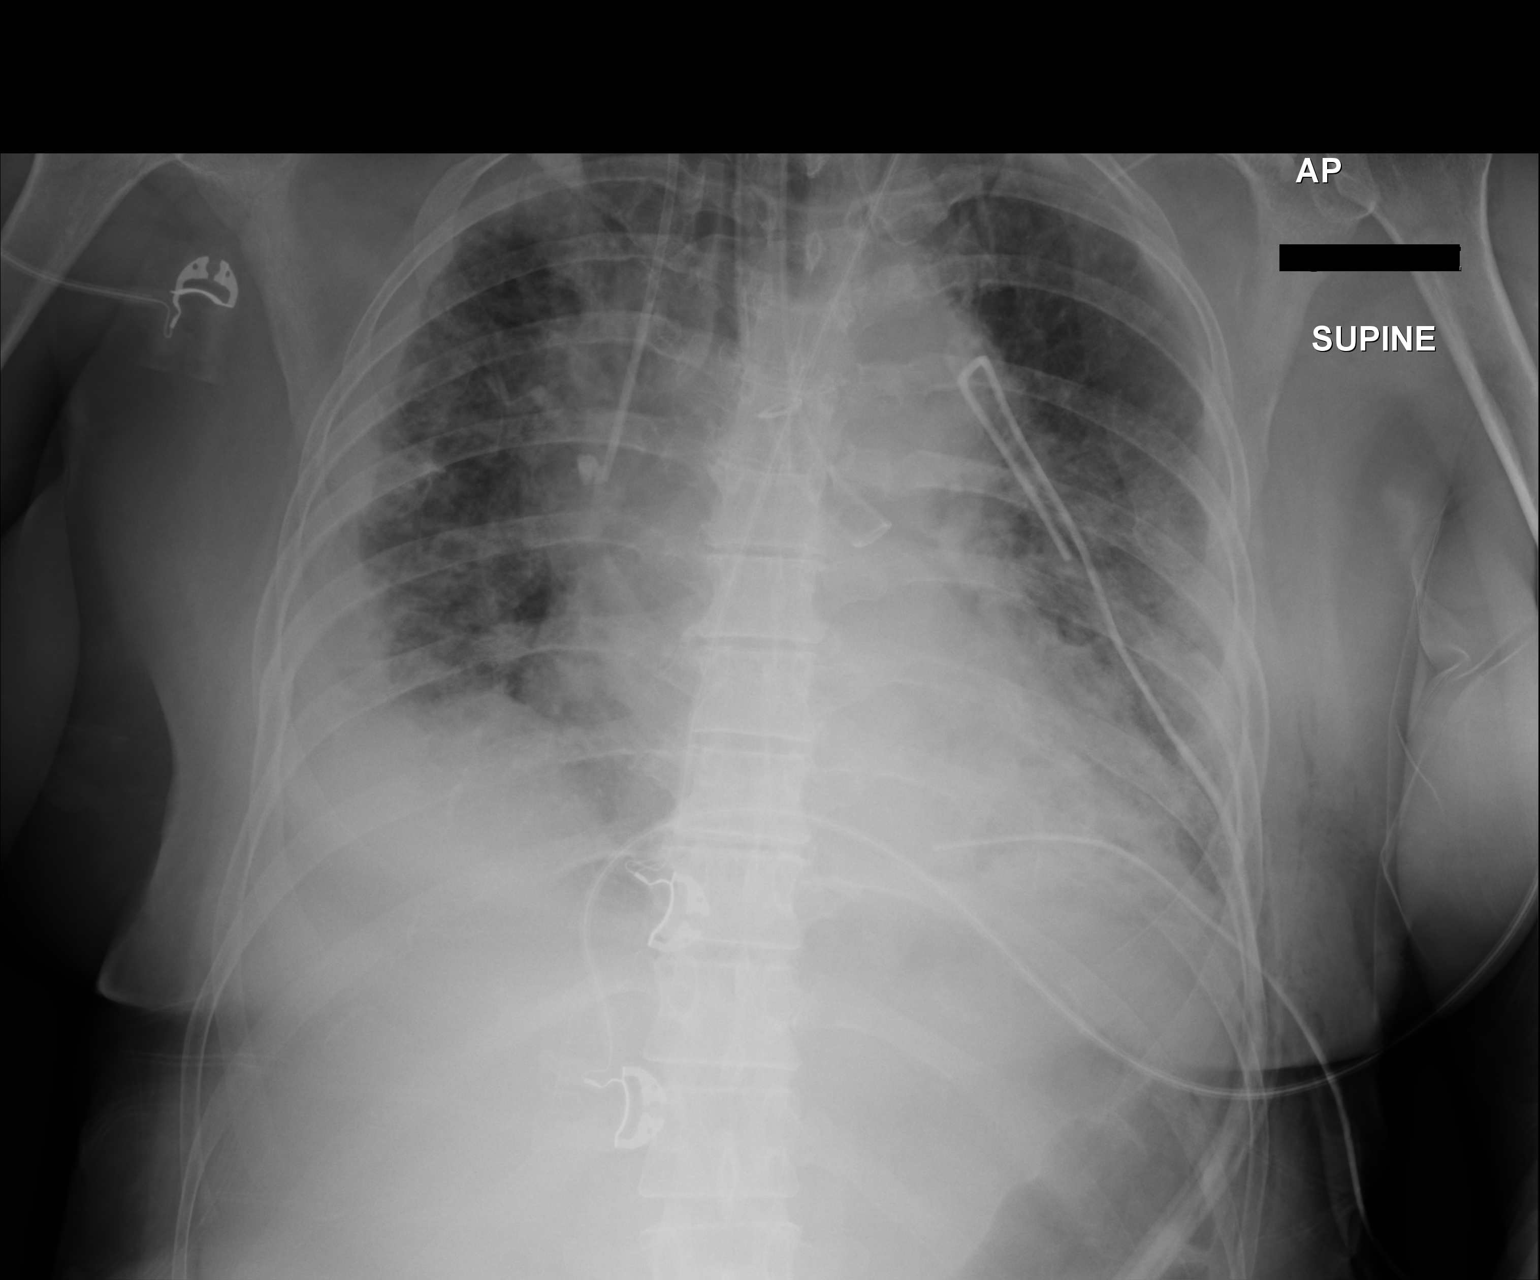

[1 of 1 positions shown; findings below may reference images not displayed]

FINDINGS: Two left-sided chest tubes. Small left pleural effusion.
Small-moderate right pleural effusion. Bilateral interstitial
thickening. No pneumothorax.

Right jugular central venous catheter with the tip projecting over
the SVC.

Dual-lumen endotracheal tube with the proximal port 2 cm with the
carina and the second tube in the left main stem bronchus.

Bilateral diffuse interstitial thickening likely reflecting mild
edema. Stable cardiomediastinal silhouette. No acute osseous
abnormality.
IMPRESSION: 1. Two left-sided chest tubes. Small left pleural effusion.
Small-moderate right pleural effusion. Bilateral interstitial
thickening. No pneumothorax.
2. Right jugular central venous catheter with the tip projecting
over the SVC.
3. Dual-lumen endotracheal tube with the proximal port 2 cm with the
carina and the second tube in the left main stem bronchus.

## 2018-06-21 IMAGING — DX DG CHEST 1V PORT
1 series · 1 of 1 positions shown · non-contrast
Comparison: December 16, 2015

CLINICAL DATA: Post thoracoscopy

EXAM:
PORTABLE CHEST 1 VIEW

[chest ap]
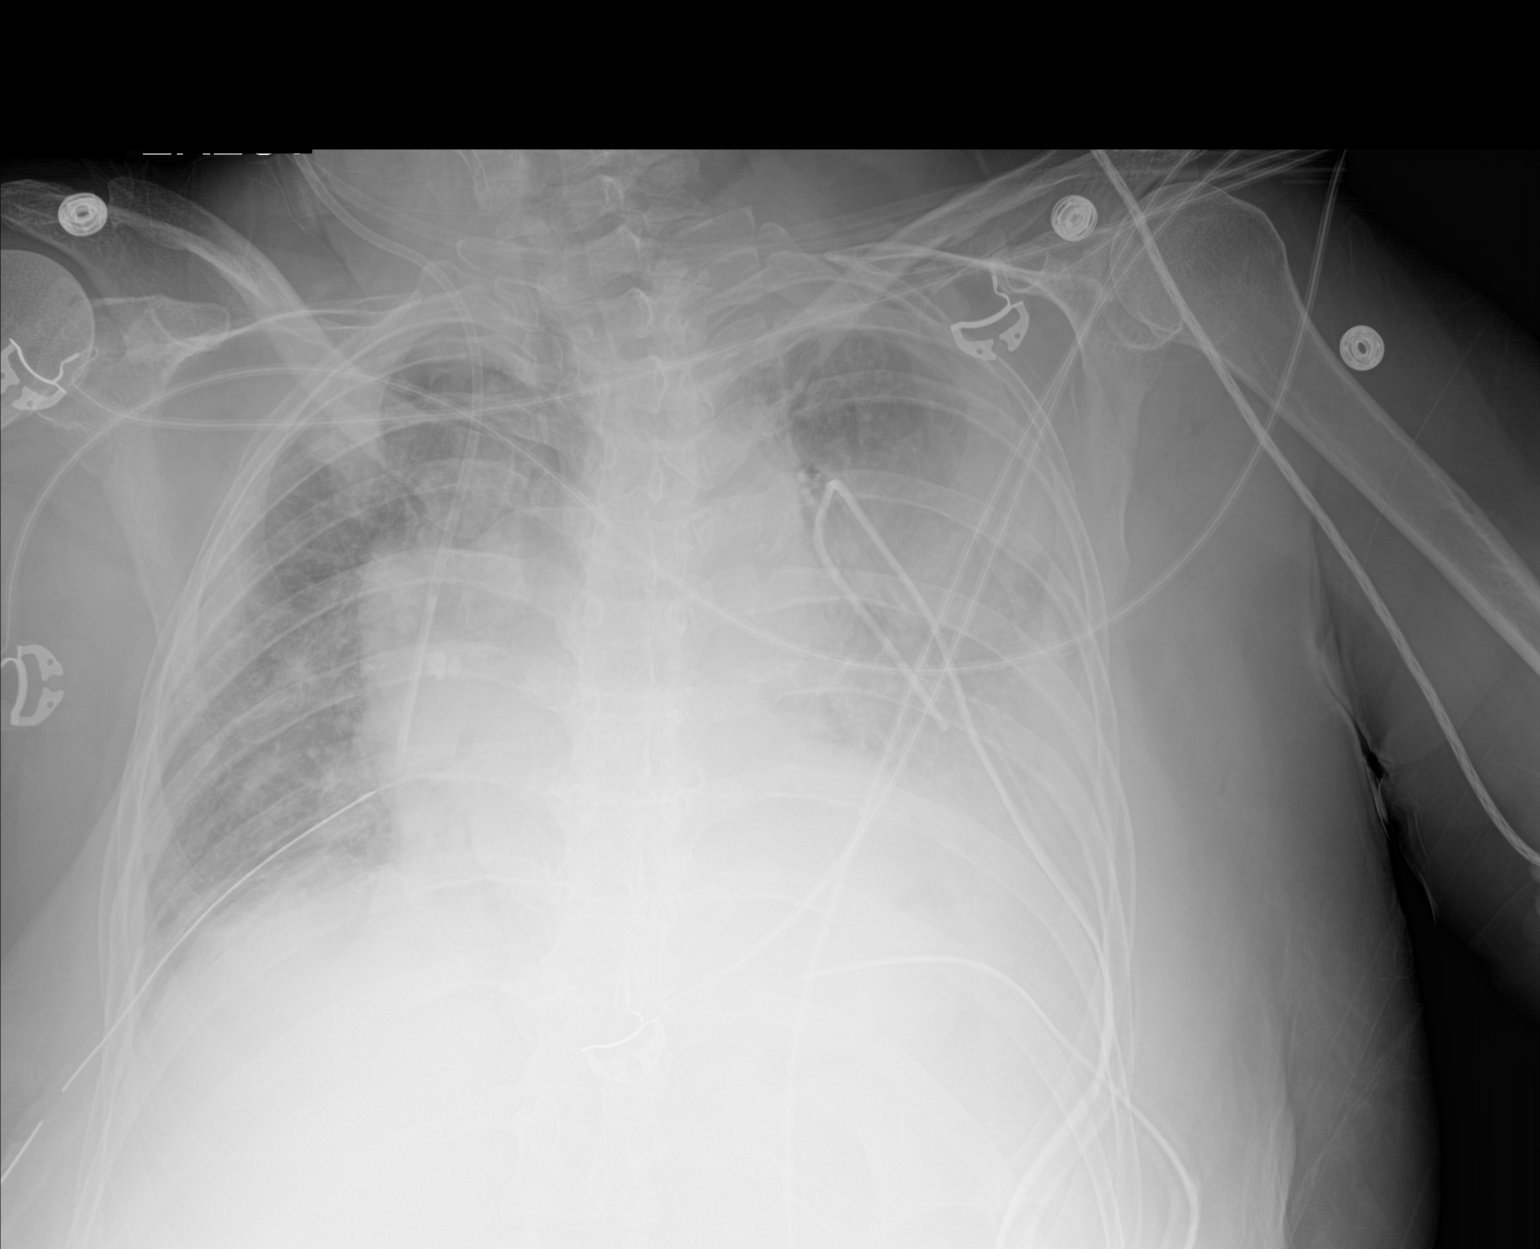

[1 of 1 positions shown; findings below may reference images not displayed]

FINDINGS: The heart size and mediastinal contours are stable. The heart size
is enlarged. Right central venous line and bilateral chest tubes are
unchanged. There is interval developed patchy consolidation
throughout the left lung with relative sparing of the left apex.
Patchy consolidation of the medial perihilar right lung is also
identified. There is no pleural line to suggest pneumothorax. The
visualized skeletal structures are stable.
IMPRESSION: Interval developed patchy consolidation throughout the left lung
with relative sparing of the left apex probably due to a combination
of increasing pleural effusion and underlying pulmonary
consolidation question pneumonia. Increased patchy consolidation of
the right perihilar region pneumonia is not excluded.

## 2018-06-24 IMAGING — CR DG CHEST 1V PORT
1 series · 1 of 1 positions shown · non-contrast
Comparison: 12/19/2015.

CLINICAL DATA: Chest tube placement.

EXAM:
PORTABLE CHEST 1 VIEW

[AP]
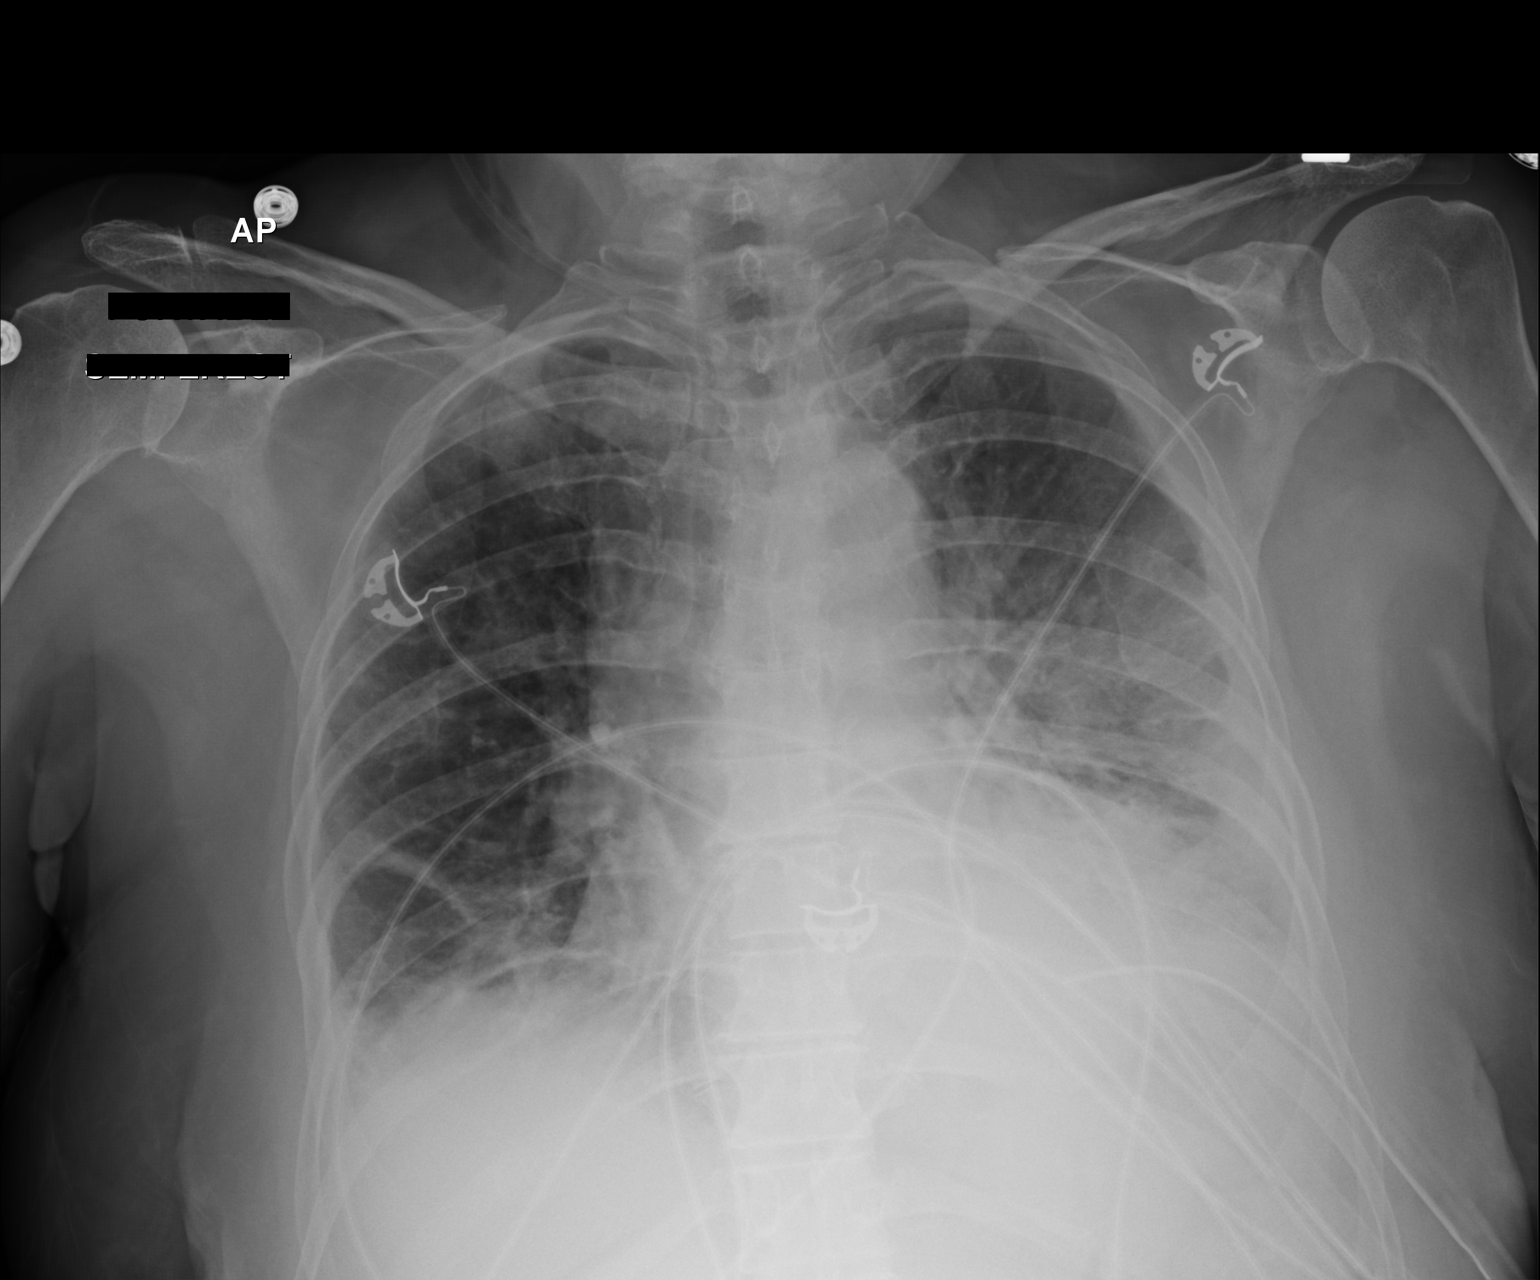

[1 of 1 positions shown; findings below may reference images not displayed]

FINDINGS: Interim removal of left upper chest tube and right PICC line. Left
lower chest tube in unchanged position. Stable cardiomegaly.
Unchanged left mid and bibasilar lung field atelectasis and/or
infiltrates. Small bilateral pleural effusions unchanged.
IMPRESSION: 1. Interim removal of left upper chest tube and right PICC line.
Left lower chest tube in unchanged position. No pneumothorax.

2. Persistent unchanged left mid lung field and bibasilar
atelectasis and/or infiltrates. Persistent unchanged small bilateral
pleural effusions.
